# Patient Record
Sex: Female | Born: 1990 | Race: Asian | Hispanic: No | Marital: Married | Smoking: Never smoker
Health system: Southern US, Community
[De-identification: ages and names within clinical notes are randomized; demographics above are authoritative.]

## PROBLEM LIST (undated history)

## (undated) DIAGNOSIS — Z789 Other specified health status: Secondary | ICD-10-CM

## (undated) HISTORY — PX: NO PAST SURGERIES: SHX2092

## (undated) HISTORY — DX: Other specified health status: Z78.9

---

## 2020-01-19 ENCOUNTER — Other Ambulatory Visit: Payer: Self-pay

## 2020-01-19 DIAGNOSIS — Z20822 Contact with and (suspected) exposure to covid-19: Secondary | ICD-10-CM

## 2020-01-20 LAB — SARS-COV-2, NAA 2 DAY TAT

## 2020-01-20 LAB — NOVEL CORONAVIRUS, NAA: SARS-CoV-2, NAA: NOT DETECTED

## 2020-04-01 NOTE — L&D Delivery Note (Signed)
OB/GYN Faculty Practice Delivery Note  Carolyn Perez is a 30 y.o. O1I1030 s/p SVD at [redacted]w[redacted]d. She was admitted for SOL.   ROM: 2h 65m with clear fluid GBS Status: negative  Delivery Date/Time: 03/02/2021 at 1356 Delivery: Called to room and patient was complete and pushing. Head delivered LOA. No nuchal cord present. Shoulder and body delivered in usual fashion. Infant with spontaneous cry, placed on mother's abdomen, dried and stimulated. Cord clamped x 2 after 1-minute delay, and cut by RN. Cord blood drawn. Placenta delivered spontaneously with gentle cord traction. Fundus firm with massage and Pitocin. Labia, perineum, vagina, and cervix were inspected, and patient was found to have a 1st degree perineal laceration that was hemostatic and did not require repair.  Placenta: intact, 3VC - sent to L&D Complications: none Lacerations: none EBL: 50 cc Analgesia: epidural   Infant: viable female  APGARs 9, 9  3255 g  Yves Dill, MD PGY1

## 2020-07-13 ENCOUNTER — Ambulatory Visit (INDEPENDENT_AMBULATORY_CARE_PROVIDER_SITE_OTHER): Payer: Self-pay

## 2020-07-13 ENCOUNTER — Encounter: Payer: Self-pay | Admitting: Obstetrics and Gynecology

## 2020-07-13 ENCOUNTER — Other Ambulatory Visit: Payer: Self-pay

## 2020-07-13 DIAGNOSIS — Z3201 Encounter for pregnancy test, result positive: Secondary | ICD-10-CM

## 2020-07-13 DIAGNOSIS — Z3200 Encounter for pregnancy test, result unknown: Secondary | ICD-10-CM

## 2020-07-13 LAB — POCT URINE PREGNANCY: Preg Test, Ur: POSITIVE — AB

## 2020-07-13 NOTE — Progress Notes (Signed)
..   Carolyn Perez presents today for UPT. She has no unusual complaints. LMP:05-30-20    OBJECTIVE: Appears well, in no apparent distress.  OB History    Gravida  3   Para  2   Term  2   Preterm      AB      Living  2     SAB      IAB      Ectopic      Multiple      Live Births  2          Home UPT Result: Positive In-Office UPT result:Positive I have reviewed the patient's medical, obstetrical, social, and family histories, and medications.   ASSESSMENT: Positive pregnancy test  PLAN Prenatal care to be completed at: [facility]

## 2020-08-02 ENCOUNTER — Other Ambulatory Visit: Payer: Self-pay

## 2020-08-02 ENCOUNTER — Emergency Department (HOSPITAL_COMMUNITY)
Admission: EM | Admit: 2020-08-02 | Discharge: 2020-08-02 | Disposition: A | Discharge status: Home / Self Care | Payer: Medicaid Other | Attending: Emergency Medicine | Admitting: Emergency Medicine

## 2020-08-02 ENCOUNTER — Encounter (HOSPITAL_COMMUNITY): Payer: Self-pay

## 2020-08-02 ENCOUNTER — Emergency Department (HOSPITAL_COMMUNITY): Payer: Medicaid Other

## 2020-08-02 DIAGNOSIS — O209 Hemorrhage in early pregnancy, unspecified: Secondary | ICD-10-CM

## 2020-08-02 DIAGNOSIS — R1030 Lower abdominal pain, unspecified: Secondary | ICD-10-CM | POA: Insufficient documentation

## 2020-08-02 DIAGNOSIS — O26891 Other specified pregnancy related conditions, first trimester: Secondary | ICD-10-CM | POA: Diagnosis not present

## 2020-08-02 DIAGNOSIS — R109 Unspecified abdominal pain: Secondary | ICD-10-CM | POA: Diagnosis not present

## 2020-08-02 DIAGNOSIS — O208 Other hemorrhage in early pregnancy: Secondary | ICD-10-CM | POA: Insufficient documentation

## 2020-08-02 DIAGNOSIS — Z3A09 9 weeks gestation of pregnancy: Secondary | ICD-10-CM | POA: Diagnosis not present

## 2020-08-02 LAB — CBC WITH DIFFERENTIAL/PLATELET
Abs Immature Granulocytes: 0.03 10*3/uL (ref 0.00–0.07)
Basophils Absolute: 0.1 10*3/uL (ref 0.0–0.1)
Basophils Relative: 1 %
Eosinophils Absolute: 0.1 10*3/uL (ref 0.0–0.5)
Eosinophils Relative: 1 %
HCT: 38.3 % (ref 36.0–46.0)
Hemoglobin: 12.2 g/dL (ref 12.0–15.0)
Immature Granulocytes: 0 %
Lymphocytes Relative: 15 %
Lymphs Abs: 1.4 10*3/uL (ref 0.7–4.0)
MCH: 27.2 pg (ref 26.0–34.0)
MCHC: 31.9 g/dL (ref 30.0–36.0)
MCV: 85.5 fL (ref 80.0–100.0)
Monocytes Absolute: 0.4 10*3/uL (ref 0.1–1.0)
Monocytes Relative: 5 %
Neutro Abs: 6.9 10*3/uL (ref 1.7–7.7)
Neutrophils Relative %: 78 %
Platelets: 224 10*3/uL (ref 150–400)
RBC: 4.48 MIL/uL (ref 3.87–5.11)
RDW: 13.1 % (ref 11.5–15.5)
WBC: 8.9 10*3/uL (ref 4.0–10.5)
nRBC: 0 % (ref 0.0–0.2)

## 2020-08-02 LAB — HCG, QUANTITATIVE, PREGNANCY: hCG, Beta Chain, Quant, S: 192212 m[IU]/mL — ABNORMAL HIGH (ref <5)

## 2020-08-02 LAB — URINALYSIS, ROUTINE W REFLEX MICROSCOPIC
Bilirubin Urine: NEGATIVE
Glucose, UA: NEGATIVE mg/dL
Hgb urine dipstick: NEGATIVE
Ketones, ur: 5 mg/dL — AB
Leukocytes,Ua: NEGATIVE
Nitrite: NEGATIVE
Protein, ur: NEGATIVE mg/dL
Specific Gravity, Urine: 1.005 (ref 1.005–1.030)
pH: 8 (ref 5.0–8.0)

## 2020-08-02 LAB — BASIC METABOLIC PANEL
Anion gap: 5 (ref 5–15)
BUN: 7 mg/dL (ref 6–20)
CO2: 24 mmol/L (ref 22–32)
Calcium: 8.7 mg/dL — ABNORMAL LOW (ref 8.9–10.3)
Chloride: 107 mmol/L (ref 98–111)
Creatinine, Ser: 0.47 mg/dL (ref 0.44–1.00)
GFR, Estimated: >60 mL/min (ref >60)
Glucose, Bld: 83 mg/dL (ref 70–99)
Potassium: 4.1 mmol/L (ref 3.5–5.1)
Sodium: 136 mmol/L (ref 135–145)

## 2020-08-02 LAB — WET PREP, GENITAL
Clue Cells Wet Prep HPF POC: NONE SEEN
Sperm: NONE SEEN
Trich, Wet Prep: NONE SEEN
Yeast Wet Prep HPF POC: NONE SEEN

## 2020-08-02 NOTE — ED Provider Notes (Signed)
[facility]-EMERGENCY DEPT Provider Note   CSN: [id number] Arrival date & time: 08/02/20  1129    History Chief Complaint  Patient presents with  . Bleeding in Pregnancy    Carolyn Perez is a 30 y.o. female G3P2 presents for vaginal bleeding in pregnancy. Vaginal bleeding, spotting, dark red this AM. Has not had to wear a pad. Mild lower abd cramping. No vag dc.  No current bleeding.  No current pain.  Denies fever, chills, nausea, vomiting, headache, lightheadedness, dizziness, dysuria, hematuria, focal right lower quadrant left lower quadrant pain.  Pain is not worse with eating or food intake.  She is having bowel movements.  She denies any concerns for any STDs.  Recently followed at [facility] for her prior pregnancies her recently moved to [place] areas.  She plans to follow-up with the health department for her OB/GYN care.  Denies additional aggravating or alleviating factors  History obtained from patient and past medical records.  No interpreter used.   B positive in [facility] History  HPI     History reviewed. No pertinent past medical history.  There are no problems to display for this patient.   History reviewed. No pertinent surgical history.   OB History    Gravida  3   Para  2   Term  2   Preterm  0   AB  0   Living  2     SAB  0   IAB  0   Ectopic  0   Multiple      Live Births  2           History reviewed. No pertinent family history.  Social History   Tobacco Use  . Smoking status: Never Smoker  . Smokeless tobacco: Never Used    Home Medications Prior to Admission medications   Not on File    Allergies    Patient has no known allergies.  Review of Systems   Review of Systems  Constitutional: Negative.   HENT: Negative.   Respiratory: Negative.   Cardiovascular: Negative.   Gastrointestinal: Positive for abdominal pain. Negative for abdominal distention, constipation, diarrhea, nausea and vomiting.   Genitourinary: Positive for vaginal bleeding. Negative for decreased urine volume, difficulty urinating, dysuria, flank pain, frequency, hematuria, menstrual problem, pelvic pain, urgency, vaginal discharge and vaginal pain.  Musculoskeletal: Negative.   Neurological: Negative.   All other systems reviewed and are negative.   Physical Exam Updated Vital Signs BP 106/71 (BP Location: Right Arm)   Pulse 87   Temp 97.6 F (36.4 C) (Oral)   Resp 18   LMP 05/30/2020   SpO2 100%   Physical Exam Vitals and nursing note reviewed. Exam conducted with a chaperone present.  Constitutional:      General: She is not in acute distress.    Appearance: She is well-developed. She is not ill-appearing, toxic-appearing or diaphoretic.  HENT:     Head: Normocephalic and atraumatic.     Nose: Nose normal.     Mouth/Throat:     Mouth: Mucous membranes are moist.  Eyes:     Pupils: Pupils are equal, round, and reactive to light.  Cardiovascular:     Rate and Rhythm: Normal rate.     Pulses: Normal pulses.     Heart sounds: Normal heart sounds.  Pulmonary:     Effort: Pulmonary effort is normal. No respiratory distress.     Breath sounds: Normal breath sounds.  Abdominal:  General: Bowel sounds are normal. There is no distension.     Palpations: Abdomen is soft.     Tenderness: There is abdominal tenderness in the suprapubic area.     Hernia: No hernia is present.     Comments: Minimal lower abd pain. No rebound or guarding.  Genitourinary:    Comments: Normal appearing external female genitalia without rashes or lesions, normal vaginal epithelium. Normal appearing cervix without discharge or petechiae. Cervical os is closed. There is scant bleeding noted at the os. No odor. Bimanual: No CMT, nontender.  No palpable adnexal masses or tenderness. Uterus midline and not fixed. Rectovaginal exam was deferred.  No cystocele or rectocele noted. No pelvic lymphadenopathy noted. Wet prep was  obtained.  Cultures for gonorrhea and chlamydia collected. Exam performed with chaperone in room. Musculoskeletal:        General: Normal range of motion.     Cervical back: Normal range of motion.  Skin:    General: Skin is warm and dry.  Neurological:     Mental Status: She is alert.     ED Results / Procedures / Treatments   Labs (all labs ordered are listed, but only abnormal results are displayed) Labs Reviewed  WET PREP, GENITAL - Abnormal; Notable for the following components:      Result Value   WBC, Wet Prep HPF POC MANY (*)    All other components within normal limits  BASIC METABOLIC PANEL - Abnormal; Notable for the following components:   Calcium 8.7 (*)    All other components within normal limits  HCG, QUANTITATIVE, PREGNANCY - Abnormal; Notable for the following components:   hCG, Beta Chain, Quant, S 419,379 (*)    All other components within normal limits  URINALYSIS, ROUTINE W REFLEX MICROSCOPIC - Abnormal; Notable for the following components:   Color, Urine STRAW (*)    Ketones, ur 5 (*)    All other components within normal limits  CBC WITH DIFFERENTIAL/PLATELET  GC/CHLAMYDIA PROBE AMP ([place]) NOT AT [facility]    EKG None  Radiology US OB Comp < 14 Wks  Result Date: 08/02/2020 CLINICAL DATA:  Vaginal bleeding in 1st trimester pregnancy. Unsure of LMP. EXAM: OBSTETRIC <14 WK ULTRASOUND TECHNIQUE: Transabdominal ultrasound was performed for evaluation of the gestation as well as the maternal uterus and adnexal regions. COMPARISON:  None. FINDINGS: Intrauterine gestational sac: Single Yolk sac:  Visualized. Embryo:  Visualized. Cardiac Activity: Visualized. Heart Rate: 180 bpm CRL:   25 mm   9 w 1 d                  [place] EDC: 03/07/2021 Subchorionic hemorrhage:  None visualized. Maternal uterus/adnexae: Normal appearance of right ovary. Nonvisualization of left ovary, however no mass or abnormal free fluid identified. IMPRESSION: Single living IUP with  estimated gestational age of [redacted] weeks 1 day, and [place] EDC of 03/07/2021. No maternal uterine or adnexal abnormality identified. Electronically Signed   By: Danae Orleans M.D.   On: 08/02/2020 14:54    Procedures Procedures   Medications Ordered in ED Medications - No data to display  ED Course  I have reviewed the triage vital signs and the nursing notes.  Pertinent labs & imaging results that were available during my care of the patient were reviewed by me and considered in my medical decision making (see chart for details).  30 year old presents for evaluation of bleeding in setting of pregnancy.  Has had ultrasound establish IUP.  Began having  some scant blood this morning.  She is back to her pad.  She has no lightness or dizziness.  Does occasionally have some lower abdominal cramping however none currently.  She has no focal pain.  No urinary complaints. 9 weeks by dates.  Plan on labs, imaging and reassess  Labs and imaging personally reviewed and interpreted:  CBC without leukocytosis Metabolic panel with electrolyte, renal normality UA negative for infection HCG 192.212 Wet prep with many WBC Return with 9-week single IUP  GU exam with scant, old blood at os which is closed.  She has no CMT or adnexal tenderness.   Patient reassessed.  Discussed ultrasound and lab findings.  Reevaluation abdomen soft.  She has no focal pain.  I have low suspicion for  On repeat exam patient does not have a surgical abdomin and there are no peritoneal signs.  No indication of appendicitis, bowel obstruction, bowel perforation, cholecystitis, diverticulitis, PID, TOA, torsion or ectopic pregnancy.    Prior chart reviewed from [facility] in prior pregnancies blood type B positive, Does not need rhogam.  The patient has been appropriately medically screened and/or stabilized in the ED. I have low suspicion for any other emergent medical condition which would require further screening, evaluation or  treatment in the ED or require inpatient management.  Patient is hemodynamically stable and in no acute distress.  Patient able to ambulate in department prior to ED.  Evaluation does not show acute pathology that would require ongoing or additional emergent interventions while in the emergency department or further inpatient treatment.  I have discussed the diagnosis with the patient and answered all questions.  Pain is been managed while in the emergency department and patient has no further complaints prior to discharge.  Patient is comfortable with plan discussed in room and is stable for discharge at this time.  I have discussed strict return precautions for returning to the emergency department.  Patient was encouraged to follow-up with PCP/specialist refer to at discharge.     MDM Rules/Calculators/A&P                          Final Clinical Impression(s) / ED Diagnoses Final diagnoses:  Vaginal bleeding in pregnancy, first trimester  [redacted] weeks gestation of pregnancy    Rx / DC Orders ED Discharge Orders    None       Nachman Sundt A, PA-C 08/02/20 1622    Milagros Loll, MD 08/03/20 [phone]

## 2020-08-02 NOTE — ED Triage Notes (Signed)
Pt arrived via POV, c/o vaginal bleeding. Currently pregnant, due date 03/06/21. Also endorsing some minimal abd pain.

## 2020-08-02 NOTE — Discharge Instructions (Addendum)
Your ultrasound showed a single gestation pregnancy in the correct location.  Follow-up with your OB/GYN

## 2020-08-03 LAB — GC/CHLAMYDIA PROBE AMP (~~LOC~~) NOT AT ARMC
Chlamydia: NEGATIVE
Comment: NEGATIVE
Comment: NORMAL
Neisseria Gonorrhea: NEGATIVE

## 2020-08-23 ENCOUNTER — Ambulatory Visit (INDEPENDENT_AMBULATORY_CARE_PROVIDER_SITE_OTHER): Payer: Medicaid Other

## 2020-08-23 ENCOUNTER — Encounter: Payer: Medicaid Other | Admitting: Women's Health

## 2020-08-23 DIAGNOSIS — Z3480 Encounter for supervision of other normal pregnancy, unspecified trimester: Secondary | ICD-10-CM | POA: Insufficient documentation

## 2020-08-23 MED ORDER — BLOOD PRESSURE KIT DEVI: 1.0000 | 0 refills | Status: DC | PRN

## 2020-08-23 MED ORDER — VITAFOL FE+ 90-0.6-0.4-200 MG PO CAPS: 1.0000 | ORAL_CAPSULE | Freq: Every day | ORAL | 12 refills | Status: AC

## 2020-08-23 NOTE — Progress Notes (Signed)
Agree with A & P. 

## 2020-08-23 NOTE — Progress Notes (Signed)
..  Virtual Visit via Telephone Note  I connected with Carolyn Perez on 08/23/20 at  2:00 PM EDT by telephone and verified that I am speaking with the correct person using two identifiers.  Location:[facility] Patient: Carolyn Perez Provider: Nurse   I discussed the limitations, risks, security and privacy concerns of performing an evaluation and management service by telephone and the availability of in person appointments. I also discussed with the patient that there may be a patient responsible charge related to this service. The patient expressed understanding and agreed to proceed.   History of Present Illness: PRENATAL INTAKE SUMMARY  Carolyn Perez presents today New OB Nurse Interview.  OB History    Gravida  3   Para  2   Term  2   Preterm  0   AB  0   Living  2     SAB  0   IAB  0   Ectopic  0   Multiple      Live Births  2          I have reviewed the patient's medical, obstetrical, social, and family histories, medications, and available lab results.  SUBJECTIVE She has no unusual complaints   Observations/Objective: Initial nurse interview for history/labs (New OB)  EDD: 03-06-21 GA: 46w1dGP: G3P1  GENERAL APPEARANCE: Tele-visit, pt sounded alert and oriented  Assessment and Plan: Normal pregnancy Prenatal care: [facility]  Labs to be completed at next visit with provider on 09-01-20. PHQ-9= 2 Blood pressure kit sent to [facility]   Follow Up Instructions:   I discussed the assessment and treatment plan with the patient. The patient was provided an opportunity to ask questions and all were answered. The patient agreed with the plan and demonstrated an understanding of the instructions.   The patient was advised to call back or seek an in-person evaluation if the symptoms worsen or if the condition fails to improve as anticipated.  I provided 15 minutes of non-face-to-face time during this encounter.   BHinton Lovely RN

## 2020-09-01 ENCOUNTER — Other Ambulatory Visit: Payer: Self-pay

## 2020-09-01 ENCOUNTER — Ambulatory Visit (INDEPENDENT_AMBULATORY_CARE_PROVIDER_SITE_OTHER): Payer: Medicaid Other | Admitting: Obstetrics

## 2020-09-01 ENCOUNTER — Encounter: Payer: Self-pay | Admitting: Obstetrics

## 2020-09-01 VITALS — BP 111/77 | HR 102 | Wt 122.0 lb

## 2020-09-01 DIAGNOSIS — Z3A13 13 weeks gestation of pregnancy: Secondary | ICD-10-CM | POA: Diagnosis not present

## 2020-09-01 DIAGNOSIS — Z3480 Encounter for supervision of other normal pregnancy, unspecified trimester: Secondary | ICD-10-CM

## 2020-09-01 DIAGNOSIS — Z3482 Encounter for supervision of other normal pregnancy, second trimester: Secondary | ICD-10-CM | POA: Diagnosis not present

## 2020-09-01 NOTE — Progress Notes (Signed)
Subjective:    Carolyn Perez is being seen today for her first obstetrical visit.  This is a planned pregnancy. She is at [redacted]w[redacted]d gestation. Her obstetrical history is significant for none. Relationship with FOB: spouse, living together. Patient does intend to breast feed. Pregnancy history fully reviewed.  The information documented in the HPI was reviewed and verified.  Menstrual History: OB History    Gravida  3   Para  2   Term  2   Preterm  0   AB  0   Living  2     SAB  0   IAB  0   Ectopic  0   Multiple      Live Births  2            Patient's last menstrual period was 05/30/2020.    History reviewed. No pertinent past medical history.  History reviewed. No pertinent surgical history.  (Not in a hospital admission)  No Known Allergies  Social History   Tobacco Use  . Smoking status: Never Smoker  . Smokeless tobacco: Never Used  Substance Use Topics  . Alcohol use: Never    History reviewed. No pertinent family history.   Review of Systems Constitutional: negative for weight loss Gastrointestinal: negative for vomiting Genitourinary:negative for genital lesions and vaginal discharge and dysuria Musculoskeletal:negative for back pain Behavioral/Psych: negative for abusive relationship, depression, illegal drug usage and tobacco use    Objective:    BP 111/77   Pulse (!) 102   Wt 122 lb (55.3 kg)   LMP 05/30/2020   BMI 21.61 kg/m  General Appearance:    Alert, cooperative, no distress, appears stated age  Head:    Normocephalic, without obvious abnormality, atraumatic  Eyes:    PERRL, conjunctiva/corneas clear, EOM's intact, fundi    benign, both eyes  Ears:    Normal TM's and external ear canals, both ears  Nose:   Nares normal, septum midline, mucosa normal, no drainage    or sinus tenderness  Throat:   Lips, mucosa, and tongue normal; teeth and gums normal  Neck:   Supple, symmetrical, trachea midline, no adenopathy;    thyroid:  no  enlargement/tenderness/nodules; no carotid   bruit or JVD  Back:     Symmetric, no curvature, ROM normal, no CVA tenderness  Lungs:     Clear to auscultation bilaterally, respirations unlabored  Chest Wall:    No tenderness or deformity   Heart:    Regular rate and rhythm, S1 and S2 normal, no murmur, rub   or gallop  Breast Exam:    No tenderness, masses, or nipple abnormality  Abdomen:     Soft, non-tender, bowel sounds active all four quadrants,    no masses, no organomegaly  Genitalia:    Normal female without lesion, discharge or tenderness  Extremities:   Extremities normal, atraumatic, no cyanosis or edema  Pulses:   2+ and symmetric all extremities  Skin:   Skin color, texture, turgor normal, no rashes or lesions  Lymph nodes:   Cervical, supraclavicular, and axillary nodes normal  Neurologic:   CNII-XII intact, normal strength, sensation and reflexes    throughout      Lab Review Urine pregnancy test Labs reviewed yes Radiologic studies reviewed no  Assessment:    Pregnancy at [redacted]w[redacted]d weeks    Plan:      Prenatal vitamins.  Counseling provided regarding continued use of seat belts, cessation of alcohol consumption, smoking or use of illicit  drugs; infection precautions i.e., influenza/TDAP immunizations, toxoplasmosis,CMV, parvovirus, listeria and varicella; workplace safety, exercise during pregnancy; routine dental care, safe medications, sexual activity, hot tubs, saunas, pools, travel, caffeine use, fish and methlymercury, potential toxins, hair treatments, varicose veins Weight gain recommendations per IOM guidelines reviewed: underweight/BMI< 18.5--> gain 28 - 40 lbs; normal weight/BMI 18.5 - 24.9--> gain 25 - 35 lbs; overweight/BMI 25 - 29.9--> gain 15 - 25 lbs; obese/BMI >30->gain  11 - 20 lbs Problem list reviewed and updated. FIRST/CF mutation testing/NIPT/QUAD SCREEN/fragile X/Ashkenazi Jewish population testing/Spinal muscular atrophy discussed: requested. Role  of ultrasound in pregnancy discussed; fetal survey: requested. Amniocentesis discussed: not indicated.   Orders Placed This Encounter  Procedures  . Culture, OB Urine  . Obstetric Panel, Including HIV  . Hepatitis C Antibody  . Genetic Screening    Follow up in 4 weeks.  I have spent a total of 30 minutes of face-to-face time, excluding clinical staff time, reviewing notes and preparing to see patient, ordering tests and/or medications, and counseling the patient.  Brock Bad, MD 09/01/2020 9:09 AM

## 2020-09-01 NOTE — Progress Notes (Signed)
NOB in office, intake completed on 08-23-20 and u/s done on 08-02-20. Pt reports that she is feeling fetal movement. Pt is married, planned pregnancy

## 2020-09-01 NOTE — Addendum Note (Signed)
Addended by: Natale Milch D on: 09/01/2020 11:49 AM   Modules accepted: Orders

## 2020-09-02 LAB — OBSTETRIC PANEL, INCLUDING HIV
Antibody Screen: NEGATIVE
Basophils Absolute: 0 10*3/uL (ref 0.0–0.2)
Basos: 0 %
EOS (ABSOLUTE): 0 10*3/uL (ref 0.0–0.4)
Eos: 1 %
HIV Screen 4th Generation wRfx: NONREACTIVE
Hematocrit: 39 % (ref 34.0–46.6)
Hemoglobin: 12.2 g/dL (ref 11.1–15.9)
Hepatitis B Surface Ag: NEGATIVE
Immature Grans (Abs): 0 10*3/uL (ref 0.0–0.1)
Immature Granulocytes: 0 %
Lymphocytes Absolute: 1.2 10*3/uL (ref 0.7–3.1)
Lymphs: 14 %
MCH: 26.1 pg — ABNORMAL LOW (ref 26.6–33.0)
MCHC: 31.3 g/dL — ABNORMAL LOW (ref 31.5–35.7)
MCV: 83 fL (ref 79–97)
Monocytes Absolute: 0.4 10*3/uL (ref 0.1–0.9)
Monocytes: 4 %
Neutrophils Absolute: 6.9 10*3/uL (ref 1.4–7.0)
Neutrophils: 81 %
Platelets: 218 10*3/uL (ref 150–450)
RBC: 4.68 x10E6/uL (ref 3.77–5.28)
RDW: 13 % (ref 11.7–15.4)
RPR Ser Ql: NONREACTIVE
Rh Factor: POSITIVE
Rubella Antibodies, IGG: 4.16 index (ref >0.99)
WBC: 8.5 10*3/uL (ref 3.4–10.8)

## 2020-09-02 LAB — HEPATITIS C ANTIBODY: Hep C Virus Ab: <0.1 s/co ratio (ref 0.0–0.9)

## 2020-09-06 LAB — URINE CULTURE, OB REFLEX

## 2020-09-06 LAB — CULTURE, OB URINE

## 2020-09-14 ENCOUNTER — Encounter: Payer: Self-pay | Admitting: Obstetrics

## 2020-09-29 ENCOUNTER — Other Ambulatory Visit: Payer: Self-pay

## 2020-09-29 ENCOUNTER — Ambulatory Visit (INDEPENDENT_AMBULATORY_CARE_PROVIDER_SITE_OTHER): Payer: Medicaid Other | Admitting: Nurse Practitioner

## 2020-09-29 VITALS — BP 124/81 | HR 96 | Wt 130.0 lb

## 2020-09-29 DIAGNOSIS — Z3480 Encounter for supervision of other normal pregnancy, unspecified trimester: Secondary | ICD-10-CM

## 2020-09-29 DIAGNOSIS — Z3A17 17 weeks gestation of pregnancy: Secondary | ICD-10-CM

## 2020-09-29 NOTE — Progress Notes (Signed)
    Subjective:  Carolyn Perez is a 30 y.o. G3P2002 at [redacted]w[redacted]d being seen today for ongoing prenatal care.  She is currently monitored for the following issues for this low-risk pregnancy and has Supervision of other normal pregnancy, antepartum on their problem list.  Patient reports  some lower abdominal pain at night when lying on her side .  Contractions: Not present. Vag. Bleeding: None.  Movement: Present. Denies leaking of fluid.   The following portions of the patient's history were reviewed and updated as appropriate: allergies, current medications, past family history, past medical history, past social history, past surgical history and problem list. Problem list updated.  Objective:   Vitals:   09/29/20 1023  BP: 124/81  Pulse: 96  Weight: 130 lb (59 kg)    Fetal Status: Fetal Heart Rate (bpm): 154 Fundal Height: 16 cm Movement: Present     General:  Alert, oriented and cooperative. Patient is in no acute distress.  Skin: Skin is warm and dry. No rash noted.   Cardiovascular: Normal heart rate noted  Respiratory: Normal respiratory effort, no problems with respiration noted  Abdomen: Soft, gravid, appropriate for gestational age. Pain/Pressure: Present     Pelvic:  Cervical exam deferred        Extremities: Normal range of motion.  Edema: None  Mental Status: Normal mood and affect. Normal behavior. Normal judgment and thought content.   Urinalysis:      Assessment and Plan:  Pregnancy: G3P2002 at [redacted]w[redacted]d  1. Supervision of other normal pregnancy, antepartum Reviewed [place] appt Having some round ligament pain at night. Drink at least 8 8-oz glasses of water every day. Discussed more aches and pains in pregnancy as she is older and this is her 3rd pregnancy Unsure about contraception  - AFP, Serum, Open Spina Bifida   Preterm labor symptoms and general obstetric precautions including but not limited to vaginal bleeding, contractions, leaking of fluid and fetal movement were  reviewed in detail with the patient. Please refer to After Visit Summary for other counseling recommendations.  Return in about 4 weeks (around 10/27/2020) for in person ROB.  Nolene Bernheim, RN, MSN, NP-BC Nurse Practitioner, [facility] for [employer], [facility] 09/29/2020 11:01 AM

## 2020-09-29 NOTE — Progress Notes (Signed)
ROB/AFP.  C/o pain when she stands up for long periods to clean her home.

## 2020-10-01 LAB — AFP, SERUM, OPEN SPINA BIFIDA
AFP MoM: 1.02
AFP Value: 46.8 ng/mL
Gest. Age on Collection Date: 17.3 weeks
Maternal Age At EDD: 32.6 yr
OSBR Risk 1 IN: 10000
Test Results:: NEGATIVE
Weight: 130 [lb_av]

## 2020-10-10 ENCOUNTER — Other Ambulatory Visit: Payer: Self-pay

## 2020-10-10 ENCOUNTER — Ambulatory Visit: Payer: Medicaid Other | Attending: Obstetrics

## 2020-10-10 ENCOUNTER — Other Ambulatory Visit: Payer: Self-pay | Admitting: *Deleted

## 2020-10-10 DIAGNOSIS — Z362 Encounter for other antenatal screening follow-up: Secondary | ICD-10-CM

## 2020-10-10 DIAGNOSIS — Z3480 Encounter for supervision of other normal pregnancy, unspecified trimester: Secondary | ICD-10-CM | POA: Insufficient documentation

## 2020-10-27 ENCOUNTER — Ambulatory Visit (INDEPENDENT_AMBULATORY_CARE_PROVIDER_SITE_OTHER): Payer: Medicaid Other | Admitting: Obstetrics & Gynecology

## 2020-10-27 ENCOUNTER — Other Ambulatory Visit: Payer: Self-pay

## 2020-10-27 VITALS — BP 112/70 | HR 93 | Wt 138.0 lb

## 2020-10-27 DIAGNOSIS — Z3480 Encounter for supervision of other normal pregnancy, unspecified trimester: Secondary | ICD-10-CM

## 2020-10-27 NOTE — Progress Notes (Deleted)
   PRENATAL VISIT NOTE  Subjective:  Carolyn Perez is a 30 y.o. G3P2002 at [redacted]w[redacted]d being seen today for ongoing prenatal care.  She is currently monitored for the following issues for this low-risk pregnancy and has Supervision of other normal pregnancy, antepartum on their problem list.  Patient reports  no FM for 3 days .  Contractions: Not present. Vag. Bleeding: None.  Movement: Present. Denies leaking of fluid.   The following portions of the patient's history were reviewed and updated as appropriate: allergies, current medications, past family history, past medical history, past social history, past surgical history and problem list.   Objective:   Vitals:   10/27/20 1011  BP: 112/70  Pulse: 93  Weight: 138 lb (62.6 kg)    Fetal Status: Fetal Heart Rate (bpm): 148   Movement: Present     General:  Alert, oriented and cooperative. Patient is in no acute distress.  Skin: Skin is warm and dry. No rash noted.   Cardiovascular: Normal heart rate noted  Respiratory: Normal respiratory effort, no problems with respiration noted  Abdomen: Soft, gravid, appropriate for gestational age.  Pain/Pressure: Absent     Pelvic: Cervical exam deferred        Extremities: Normal range of motion.  Edema: None  Mental Status: Normal mood and affect. Normal behavior. Normal judgment and thought content.   Assessment and Plan:  Pregnancy: G3P2002 at [redacted]w[redacted]d 1. Supervision of other normal pregnancy, antepartum NST good FM noted and reactive  Preterm labor symptoms and general obstetric precautions including but not limited to vaginal bleeding, contractions, leaking of fluid and fetal movement were reviewed in detail with the patient. Please refer to After Visit Summary for other counseling recommendations.   Return in about 1 week (around 11/03/2020).  Future Appointments  Date Time Provider Department Center  11/14/2020 10:45 AM [facility] NURSE [facility]  11/14/2020 11:00 AM [facility] US1 [facility]     Scheryl Darter, MD

## 2020-10-27 NOTE — Progress Notes (Signed)
   PRENATAL VISIT NOTE  Subjective:  Carolyn Perez is a 30 y.o. G3P2002 at [redacted]w[redacted]d being seen today for ongoing prenatal care.  She is currently monitored for the following issues for this low-risk pregnancy and has Supervision of other normal pregnancy, antepartum on their problem list.  Patient reports no complaints.  Contractions: Not present. Vag. Bleeding: None.  Movement: Present. Denies leaking of fluid.   The following portions of the patient's history were reviewed and updated as appropriate: allergies, current medications, past family history, past medical history, past social history, past surgical history and problem list.   Objective:   Vitals:   10/27/20 1011  BP: 112/70  Pulse: 93  Weight: 138 lb (62.6 kg)    Fetal Status: Fetal Heart Rate (bpm): 148   Movement: Present     General:  Alert, oriented and cooperative. Patient is in no acute distress.  Skin: Skin is warm and dry. No rash noted.   Cardiovascular: Normal heart rate noted  Respiratory: Normal respiratory effort, no problems with respiration noted  Abdomen: Soft, gravid, appropriate for gestational age.  Pain/Pressure: Absent     Pelvic: Cervical exam deferred        Extremities: Normal range of motion.  Edema: None  Mental Status: Normal mood and affect. Normal behavior. Normal judgment and thought content.   Assessment and Plan:  Pregnancy: G3P2002 at [redacted]w[redacted]d 1. Supervision of other normal pregnancy, antepartum 2 hr GTT next  Preterm labor symptoms and general obstetric precautions including but not limited to vaginal bleeding, contractions, leaking of fluid and fetal movement were reviewed in detail with the patient. Please refer to After Visit Summary for other counseling recommendations.   Return in about 5 weeks (around 12/01/2020).  Future Appointments  Date Time Provider Department Center  11/14/2020 10:45 AM [facility] NURSE [facility]  11/14/2020 11:00 AM [facility] US1 [facility]    Scheryl Darter,  MD

## 2020-10-27 NOTE — Progress Notes (Signed)
+   fetal movement. No complaints.  

## 2020-11-11 ENCOUNTER — Ambulatory Visit: Payer: Medicaid Other | Attending: Maternal & Fetal Medicine

## 2020-11-14 ENCOUNTER — Other Ambulatory Visit: Payer: Self-pay | Admitting: *Deleted

## 2020-11-14 ENCOUNTER — Other Ambulatory Visit: Payer: Self-pay

## 2020-11-14 ENCOUNTER — Encounter: Payer: Self-pay | Admitting: *Deleted

## 2020-11-14 ENCOUNTER — Ambulatory Visit: Payer: Medicaid Other | Admitting: *Deleted

## 2020-11-14 ENCOUNTER — Ambulatory Visit: Payer: Medicaid Other | Attending: Maternal & Fetal Medicine

## 2020-11-14 VITALS — BP 109/65 | HR 86

## 2020-11-14 DIAGNOSIS — Z363 Encounter for antenatal screening for malformations: Secondary | ICD-10-CM

## 2020-11-14 DIAGNOSIS — Z3A24 24 weeks gestation of pregnancy: Secondary | ICD-10-CM | POA: Diagnosis not present

## 2020-11-14 DIAGNOSIS — O322XX0 Maternal care for transverse and oblique lie, not applicable or unspecified: Secondary | ICD-10-CM

## 2020-11-14 DIAGNOSIS — Z3689 Encounter for other specified antenatal screening: Secondary | ICD-10-CM

## 2020-11-14 DIAGNOSIS — Z362 Encounter for other antenatal screening follow-up: Secondary | ICD-10-CM | POA: Insufficient documentation

## 2020-11-14 DIAGNOSIS — Z148 Genetic carrier of other disease: Secondary | ICD-10-CM

## 2020-11-30 ENCOUNTER — Ambulatory Visit (INDEPENDENT_AMBULATORY_CARE_PROVIDER_SITE_OTHER): Payer: Medicaid Other | Admitting: Women's Health

## 2020-11-30 ENCOUNTER — Other Ambulatory Visit: Payer: Self-pay

## 2020-11-30 ENCOUNTER — Other Ambulatory Visit: Payer: Medicaid Other

## 2020-11-30 VITALS — BP 109/71 | HR 90 | Wt 145.0 lb

## 2020-11-30 DIAGNOSIS — Z3480 Encounter for supervision of other normal pregnancy, unspecified trimester: Secondary | ICD-10-CM

## 2020-11-30 DIAGNOSIS — Z3A26 26 weeks gestation of pregnancy: Secondary | ICD-10-CM

## 2020-11-30 DIAGNOSIS — N949 Unspecified condition associated with female genital organs and menstrual cycle: Secondary | ICD-10-CM | POA: Insufficient documentation

## 2020-11-30 DIAGNOSIS — D563 Thalassemia minor: Secondary | ICD-10-CM

## 2020-11-30 DIAGNOSIS — O4440 Low lying placenta NOS or without hemorrhage, unspecified trimester: Secondary | ICD-10-CM | POA: Insufficient documentation

## 2020-11-30 NOTE — Patient Instructions (Addendum)
Maternity Assessment Unit (MAU)  The Maternity Assessment Unit (MAU) is located at the [facility]. The address is: [address], [address], [place], [place] [address]. Please see map below for additional directions.    The Maternity Assessment Unit is designed to help you during your pregnancy, and for up to 6 weeks after delivery, with any pregnancy- or postpartum-related emergencies, if you think you are in labor, or if your water has broken. For example, if you experience nausea and vomiting, vaginal bleeding, severe abdominal or pelvic pain, elevated blood pressure or other problems related to your pregnancy or postpartum time, please come to the Maternity Assessment Unit for assistance.       Preterm Labor The normal length of a pregnancy is 39-41 weeks. Preterm labor is when labor starts before 37 completed weeks of pregnancy. Babies who are born prematurely and survive may not be fully developed and may be at an increased risk for long-term problems such as cerebral palsy, developmental delays, and vision and hearing problems. Babies who are born too early may have problems soon after birth. Premature babies may have problems regulating blood sugar, body temperature, heart rate, and breathing rate. These babies often have trouble with feeding. The risk of having problems is highest for babies who are born before 34 weeks of pregnancy. What are the causes? The exact cause of this condition is not known. What increases the risk? You are more likely to have preterm labor if you have certain risk factors that relate to your medical history, problems with present and past pregnancies, and lifestyle factors. Medical history You have abnormalities of the uterus, including a short cervix. You have STIs (sexually transmitted infections) or other infections of the urinary tract and the vagina. You have chronic illnesses, such as blood clotting  problems, diabetes, or high blood pressure. You are overweight or underweight. Present and past pregnancies You have had preterm labor before. You are pregnant with twins or other multiples. You have been diagnosed with a condition in which the placenta covers your cervix (placenta previa). You waited less than 18 months between giving birth and becoming pregnant again. Your unborn baby has some abnormalities. You have vaginal bleeding during pregnancy. You became pregnant through in vitro fertilization (IVF). Lifestyle and environmental factors You use tobacco products or drink alcohol. You use drugs. You have stress and no social support. You experience domestic violence. You are exposed to certain chemicals or environmental pollutants. Other factors You are younger than age 17 or older than age 35. What are the signs or symptoms? Symptoms of this condition include: Cramps similar to those that can happen during a menstrual period. The cramps may happen with diarrhea. Pain in the abdomen or lower back. Regular contractions that may feel like tightening of the abdomen. A feeling of increased pressure in the pelvis. Increased watery or bloody mucus discharge from the vagina. Water breaking (ruptured amniotic sac). How is this diagnosed? This condition is diagnosed based on: Your medical history and a physical exam. A pelvic exam. An ultrasound. Monitoring your uterus for contractions. Other tests, including: A swab of the cervix to check for a chemical called fetal fibronectin. Urine tests. How is this treated? Treatment for this condition depends on the length of your pregnancy, your condition, and the health of your baby. Treatment may include: Taking medicines, such as: Hormone medicines. These may be given early in pregnancy to help support the pregnancy. Medicines to stop   contractions. Medicines to help mature the baby's lungs. These may be prescribed if the risk of  delivery is high. Medicines to help protect your baby from brain and nerve complications such as cerebral palsy. Bed rest. If the labor happens before 34 weeks of pregnancy, you may need to stay in the hospital. Delivery of the baby. Follow these instructions at home:  Do not use any products that contain nicotine or tobacco. These products include cigarettes, chewing tobacco, and vaping devices, such as e-cigarettes. If you need help quitting, ask your health care provider. Do not drink alcohol. Take over-the-counter and prescription medicines only as told by your health care provider. Rest as told by your health care provider. Return to your normal activities as told by your health care provider. Ask your health care provider what activities are safe for you. Keep all follow-up visits. This is important. How is this prevented? To increase your chance of having a full-term pregnancy: Do not use drugs or take medicines that have not been prescribed to you during your pregnancy. Talk with your health care provider before taking any herbal supplements, even if you have been taking them regularly. Make sure you gain a healthy amount of weight during your pregnancy. Watch for infection. If you think that you might have an infection, get it checked right away. Symptoms of infection may include: Fever. Abnormal vaginal discharge or discharge that smells bad. Pain or burning with urination. Needing to urinate urgently. Frequently urinating or passing small amounts of urine frequently. Blood in your urine or urine that smells bad or unusual. Where to find more information U.S. Department of Health and CytogeneticistHuman Services Office on Women's Health: http://hoffman.com/www.womenshealth.gov [employer]: www.acog.org Centers for Disease Control and Prevention, Preterm Birth: FootballExhibition.com.brwww.cdc.gov Contact a health care provider if: You think you are going into preterm labor. You have signs  or symptoms of preterm labor. You have symptoms of infection. Get help right away if: You are having regular, painful contractions every 5 minutes or less. Your water breaks. Summary Preterm labor is labor that starts before you reach 37 weeks of pregnancy. Delivering your baby early increases your baby's risk of developing long-term problems. You are more likely to have preterm labor if you have certain risk factors that relate to your medical history, problems with present and past pregnancies, and lifestyle factors. Keep all follow-up visits. This is important. Contact a health care provider if you have signs or symptoms of preterm labor. This information is not intended to replace advice given to you by your health care provider. Make sure you discuss any questions you have with your health care provider. Document Revised: 03/21/2020 Document Reviewed: 03/21/2020 Elsevier Patient Education  2022 Elsevier Inc.       Childbirth Education Options: [facility] Classes:  Childbirth education classes can help you get ready for a positive parenting experience. You can also meet other expectant parents and get free stuff for your baby. Each class runs for five weeks on the same night and costs $45 for the mother-to-be and her support person. Medicaid covers the cost if you are eligible. Call [phone] to register. [facility] Childbirth Education: Classes can vary in availability and schedule is subject to change. For most up-to-date information please visit [email or web address] to review and register.             AREA PEDIATRIC/FAMILY PRACTICE PHYSICIANS  [facility] [phone]  [place], [place] [address] Phone - [phone]   Fax - [phone]  JACK AMOS [address] [place], [place]  [address] Phone - [phone]   Fax - [phone]  [facility] [address] [place], [place]   [address] Phone - [phone]   Fax - [phone]  [facility] [address] [place], [place]  [address] Phone - [phone]   Fax - [phone]  [facility] [address] [place], [place]  [address] Phone - [phone]   Fax - [phone]  [facility] [address] [place], [place]  [address] Phone - [phone]   Fax - [phone]  [facility] [place] [address] [place], [place]  [address] Phone - [phone]   Fax - [phone]  [facility] AT [facility] [address] [place] [place], [place]  [address] Phone - [phone]   Fax - [phone]  [facility] AT [facility] [address] [place], [place]  [address] Phone - [phone]   Fax - [phone] [facility] AT [place] [address] [place], [place]  [address] Phone - [phone]   Fax - [phone]  [facility] [address] [place], [place]  [address] Phone - [phone]   Fax - [phone]  [facility] [address] [place], [place]  [address] Phone - [phone]   Fax - [phone]  [facility] [place] [address] [place], [place]  [address] Phone - [phone]   Fax - [phone]  [facility] [address], [address] [place], [place]  [address] Phone - [phone]  [facility] [address] [place], [place]  [address] Phone - [phone]   Fax - [phone]  [facility] [address] [place], [place]  [address] Phone - [phone]   Fax - [phone]  [facility] [address] [place], [place]  [address] Phone - [phone]   Fax - [phone]  [facility] [address] [place], [place]  [address] Phone - [phone]   Fax - [phone]   [facility]  PEDIATRICS [address] [place], [place]  [address] Phone - [phone]   Fax - [phone]  [facility] [address] [place], [place]  [address] Phone - [phone]   Fax - [phone]  DAVID RUBIN [address] [place], [place]  [address] Phone - [phone]   Fax - [phone]  [facility] [address] [place], [place]  [address] Phone - [phone]   Fax - [phone]  [place] [facility] Alita Chyle [address] [place], [place]  [address] Phone - [phone]   Fax - [phone] Gerarda Fraction [address] [place], [place]  [address] Phone - [phone]   Fax - [phone]  [facility] [address] [place], [place]  [address] Phone - [phone]   Fax - [phone]  [facility] [place] [address] [place], [place]  [address] Phone - [phone]   Fax - [phone]        Contraception Choices - www.bedsider.org Contraception, also called birth control, refers to methods or devices that prevent pregnancy. Hormonal methods Contraceptive implant A contraceptive implant is a thin, plastic tube that contains a hormone that prevents pregnancy. It is different from an intrauterine  device (IUD). It is inserted into the upper part of the arm by a health care provider. Implants can be effective for up to 3 years. Progestin-only injections Progestin-only injections are injections of progestin, a synthetic form of the hormone progesterone. They are given every 3 months by a health care provider. Birth control pills Birth control pills are pills that contain hormones that prevent pregnancy. They must be taken once a day, preferably at the same time each day. A prescription is needed to use this method of contraception. Birth control patch The birth control patch contains hormones that prevent pregnancy. It is placed  on the skin and must be changed once a week for three weeks and removed on the fourth week. A prescription is needed to use this method of contraception. Vaginal ring A vaginal ring contains hormones that prevent pregnancy. It is placed in the vagina for three weeks and removed on the fourth week. After that, the process is repeated with a new ring. A prescription is needed to use this method of contraception. Emergency contraceptive Emergency contraceptives prevent pregnancy after unprotected sex. They come in pill form and can be taken up to 5 days after sex. They work best the sooner they are taken after having sex. Most emergency contraceptives are available without a prescription. This method should not be used as your only form of birth control. Barrier methods Female condom A female condom is a thin sheath that is worn over the penis during sex. Condoms keep sperm from going inside a woman's body. They can be used with a sperm-killing substance (spermicide) to increase their effectiveness. They should be thrown away after one use. Female condom A female condom is a soft, loose-fitting sheath that is put into the vagina before sex. The condom keeps sperm from going inside a woman's body. They should be thrown away after one use. Diaphragm A diaphragm is a soft, dome-shaped barrier. It is inserted into the vagina before sex, along with a spermicide. The diaphragm blocks sperm from entering the uterus, and the spermicide kills sperm. A diaphragm should be left in the vagina for 6-8 hours after sex and removed within 24 hours. A diaphragm is prescribed and fitted by a health care provider. A diaphragm should be replaced every 1-2 years, after giving birth, after gaining more than 15 lb (6.8 kg), and after pelvic surgery. Cervical cap A cervical cap is a round, soft latex or plastic cup that fits over the cervix. It is inserted into the vagina before sex, along with spermicide. It blocks sperm from  entering the uterus. The cap should be left in place for 6-8 hours after sex and removed within 48 hours. A cervical cap must be prescribed and fitted by a health care provider. It should be replaced every 2 years. Sponge A sponge is a soft, circular piece of polyurethane foam with spermicide in it. The sponge helps block sperm from entering the uterus, and the spermicide kills sperm. To use it, you make it wet and then insert it into the vagina. It should be inserted before sex, left in for at least 6 hours after sex, and removed and thrown away within 30 hours. Spermicides Spermicides are chemicals that kill or block sperm from entering the cervix and uterus. They can come as a cream, jelly, suppository, foam, or tablet. A spermicide should be inserted into the vagina with an applicator at least 10-15 minutes before sex to allow time for it to work. The process must be  repeated every time you have sex. Spermicides do not require a prescription. Intrauterine contraception Intrauterine device (IUD) An IUD is a T-shaped device that is put in a woman's uterus. There are two types: Hormone IUD.This type contains progestin, a synthetic form of the hormone progesterone. This type can stay in place for 3-5 years. Copper IUD.This type is wrapped in copper wire. It can stay in place for 10 years. Permanent methods of contraception Female tubal ligation In this method, a woman's fallopian tubes are sealed, tied, or blocked during surgery to prevent eggs from traveling to the uterus. Hysteroscopic sterilization In this method, a small, flexible insert is placed into each fallopian tube. The inserts cause scar tissue to form in the fallopian tubes and block them, so sperm cannot reach an egg. The procedure takes about 3 months to be effective. Another form of birth control must be used during those 3 months. Female sterilization This is a procedure to tie off the tubes that carry sperm (vasectomy). After the  procedure, the man can still ejaculate fluid (semen). Another form of birth control must be used for 3 months after the procedure. Natural planning methods Natural family planning In this method, a couple does not have sex on days when the woman could become pregnant. Calendar method In this method, the woman keeps track of the length of each menstrual cycle, identifies the days when pregnancy can happen, and does not have sex on those days. Ovulation method In this method, a couple avoids sex during ovulation. Symptothermal method This method involves not having sex during ovulation. The woman typically checks for ovulation by watching changes in her temperature and in the consistency of cervical mucus. Post-ovulation method In this method, a couple waits to have sex until after ovulation. Where to find more information Centers for Disease Control and Prevention: FootballExhibition.com.br Summary Contraception, also called birth control, refers to methods or devices that prevent pregnancy. Hormonal methods of contraception include implants, injections, pills, patches, vaginal rings, and emergency contraceptives. Barrier methods of contraception can include female condoms, female condoms, diaphragms, cervical caps, sponges, and spermicides. There are two types of IUDs (intrauterine devices). An IUD can be put in a woman's uterus to prevent pregnancy for 3-5 years. Permanent sterilization can be done through a procedure for males and females. Natural family planning methods involve nothaving sex on days when the woman could become pregnant. This information is not intended to replace advice given to you by your health care provider. Make sure you discuss any questions you have with your health care provider. Document Revised: 08/23/2019 Document Reviewed: 08/23/2019 Elsevier Patient Education  2022 Elsevier Inc.       Round Ligament Pain The round ligaments are a pair of cord-like tissues that help  support the uterus. They can become a source of pain during pregnancy as the ligaments soften and stretch as the baby grows. The pain usually begins in the second trimester (13-28 weeks) of pregnancy, and should only last for a few seconds when it occurs. However, the pain can come and go until the baby is delivered. The pain does not cause harm to the baby. Round ligament pain is usually a short, sharp, and pinching pain, but it can also be a dull, lingering, and aching pain. The pain is felt in the lower side of the abdomen or in the groin. It usually starts deep in the groin and moves up to the outside of the hip area. The pain may happen when you: Suddenly  change position, such as quickly going from a sitting to standing position. Do physical activity. Cough or sneeze. Follow these instructions at home: Managing pain  When the pain starts, relax. Then, try any of these methods to help with the pain: Sit down. Flex your knees up to your abdomen. Lie on your side with one pillow under your abdomen and another pillow between your legs. Sit in a warm bath for 15-20 minutes or until the pain goes away. General instructions Watch your condition for any changes. Move slowly when you sit down or stand up. Stop or reduce your physical activities if they cause pain. Avoid long walks if they cause pain. Take over-the-counter and prescription medicines only as told by your health care provider. Keep all follow-up visits. This is important. Contact a health care provider if: Your pain does not go away with treatment. You feel pain in your back that you did not have before. Your medicine is not helping. You have a fever or chills. You have nausea or vomiting. You have diarrhea. You have pain when you urinate. Get help right away if: You have pain that is a rhythmic, cramping pain similar to labor pains. Labor pains are usually 2 minutes apart, last for about 1 minute, and involve a bearing down  feeling or pressure in your pelvis. You have vaginal bleeding. These symptoms may represent a serious problem that is an emergency. Do not wait to see if the symptoms will go away. Get medical help right away. Call your local emergency services (911 in the [place] Do not drive yourself to the hospital. Summary Round ligament pain is felt in the lower abdomen or groin. This pain usually begins in the second trimester (13-28 weeks) and should only last for a few seconds when it occurs. You may notice the pain when you suddenly change position, when you cough or sneeze, or during physical activity. Relaxing, flexing your knees to your abdomen, lying on one side, or taking a warm bath may help to get rid of the pain. Contact your health care provider if the pain does not go away. This information is not intended to replace advice given to you by your health care provider. Make sure you discuss any questions you have with your health care provider. Document Revised: 05/31/2020 Document Reviewed: 05/31/2020 Elsevier Patient Education  2022 Elsevier Inc.       Postpartum Tubal Ligation Postpartum tubal ligation (PPTL) is a procedure to close the fallopian tubes. This is done to prevent pregnancy. When the fallopian tubes are closed, the eggs that the ovaries release cannot enter the uterus, and sperm cannot reach the eggs. PPTL is done right after childbirth or 1-2 days after childbirth, before the uterus returns to its normal position. If you have a cesarean section, it can be performed at the same time as the procedure. Having this done after childbirth does not make your stay in the hospital longer. PPTL is sometimes called "getting your tubes tied." You should not have this procedure if you want to get pregnant again or if you are unsure about having more children. Tell a health care provider about: Any allergies you have. All medicines you are taking, including vitamins, herbs, eye drops,  creams, and over-the-counter medicines. Any problems you or family members have had with anesthetic medicines. Any blood disorders you have. Any surgeries you have had. Any medical conditions you have or have had. Any past pregnancies. What are the risks? Generally, this is a safe  procedure. However, problems may occur, including: Infection. Bleeding. Damage to other organs in the abdomen. Side effects from anesthetic medicines. Failure of the procedure. If this happens, you could get pregnant. Ectopic pregnancy. This is a pregnancy in which the egg attaches outside the uterus. What happens before the procedure? Ask your health care provider about: How much pain you can expect to have. What medicines you will be given for pain, especially if you are breastfeeding. What happens during the procedure? If you had a vaginal delivery: An IV will be inserted into one of your veins. You will be given one or more of the following: A medicine to help you relax (sedative). A medicine to numb the area (local anesthetic). A medicine to make you fall asleep (general anesthetic). A medicine that is injected into an area of your body to numb everything below the injection site (regional anesthetic). If you have been given a general anesthetic, a tube will be put down your throat to help you breathe. Your bladder may be emptied with a small tube (catheter). An incision will be made just below your belly button. Your fallopian tubes will be located and brought up through the incision. Your fallopian tubes will be tied off, burned (cauterized), or blocked with a clip, ring, or clamp. A small part in the center of each fallopian tube may be removed. The incision will be closed with stitches (sutures). A bandage (dressing) will be placed over the incision. If you had a cesarean delivery: Tubal ligation will be done through the incision that was used for the cesarean delivery of your baby. The incision  will be closed with sutures. A dressing will be placed over the incision. The procedure may vary among health care providers and hospitals. What happens after the procedure? Your blood pressure, heart rate, breathing rate, and blood oxygen level will be monitored until you leave the hospital. You will be given pain medicine as needed. You will be encouraged to get up early and walk to prevent blood clots. If you were given a sedative during the procedure, it can affect you for several hours. Do not drive or operate machinery until your health care provider says that it is safe. Summary Postpartum tubal ligation is a procedure that closes the fallopian tubes to prevent pregnancy. This procedure is done while you are still in the hospital after childbirth. If you have a cesarean section, it can be performed at the same time. Having this done after childbirth does not make your stay in the hospital longer. Postpartum tubal ligation is considered permanent. You should not have this procedure if you want to get pregnant again or if you are unsure about having more children. Talk to your health care provider to see if this procedure is right for you. This information is not intended to replace advice given to you by your health care provider. Make sure you discuss any questions you have with your health care provider. Document Revised: 12/03/2019 Document Reviewed: 12/03/2019 Elsevier Patient Education  2022 [employer].

## 2020-11-30 NOTE — Progress Notes (Signed)
+   Fetal movement. Pt c/o round ligament pain.

## 2020-11-30 NOTE — Progress Notes (Signed)
Subjective:  Carolyn Perez is a 30 y.o. G3P2002 at [redacted]w[redacted]d being seen today for ongoing prenatal care.  She is currently monitored for the following issues for this low-risk pregnancy and has Supervision of other normal pregnancy, antepartum; Alpha thalassemia silent carrier; Low-lying placenta; and Round ligament pain on their problem list.  Patient reports no complaints.  Contractions: Not present. Vag. Bleeding: None.  Movement: Present. Denies leaking of fluid.   The following portions of the patient's history were reviewed and updated as appropriate: allergies, current medications, past family history, past medical history, past social history, past surgical history and problem list. Problem list updated.  Objective:   Vitals:   11/30/20 0818  BP: 109/71  Pulse: 90  Weight: 145 lb (65.8 kg)    Fetal Status: Fetal Heart Rate (bpm): 159   Movement: Present     General:  Alert, oriented and cooperative. Patient is in no acute distress.  Skin: Skin is warm and dry. No rash noted.   Cardiovascular: Normal heart rate noted  Respiratory: Normal respiratory effort, no problems with respiration noted  Abdomen: Soft, gravid, appropriate for gestational age. Pain/Pressure: Present     Pelvic: Vag. Bleeding: None     Cervical exam deferred        Extremities: Normal range of motion.  Edema: None  Mental Status: Normal mood and affect. Normal behavior. Normal judgment and thought content.   Urinalysis:      Assessment and Plan:  Pregnancy: G3P2002 at [redacted]w[redacted]d  1. Supervision of other normal pregnancy, antepartum - Glucose Tolerance, 2 Hours w/1 Hour - CBC - RPR - HIV Antibody (routine testing w rflx) - Tdap vaccine greater than or equal to 7yo IM - CBE info given - peds list given - discussed contraception, info given  PHQ9 SCORE ONLY 08/23/2020  PHQ-9 Total Score 2   2. [redacted] weeks gestation of pregnancy  Preterm labor symptoms and general obstetric precautions including but not limited to  vaginal bleeding, contractions, leaking of fluid and fetal movement were reviewed in detail with the patient. I discussed the assessment and treatment plan with the patient. The patient was provided an opportunity to ask questions and all were answered. The patient agreed with the plan and demonstrated an understanding of the instructions. The patient was advised to call back or seek an in-person office evaluation/go to MAU at [facility] for any urgent or concerning symptoms. Please refer to After Visit Summary for other counseling recommendations.  Return in about 2 weeks (around 12/14/2020) for in-person LOB/APP OK.   Denijah Karrer, Odie Sera, NP

## 2020-12-01 LAB — CBC
Hematocrit: 35.1 % (ref 34.0–46.6)
Hemoglobin: 11.1 g/dL (ref 11.1–15.9)
MCH: 26.5 pg — ABNORMAL LOW (ref 26.6–33.0)
MCHC: 31.6 g/dL (ref 31.5–35.7)
MCV: 84 fL (ref 79–97)
Platelets: 186 10*3/uL (ref 150–450)
RBC: 4.19 x10E6/uL (ref 3.77–5.28)
RDW: 12.2 % (ref 11.7–15.4)
WBC: 7.7 10*3/uL (ref 3.4–10.8)

## 2020-12-01 LAB — HIV ANTIBODY (ROUTINE TESTING W REFLEX): HIV Screen 4th Generation wRfx: NONREACTIVE

## 2020-12-01 LAB — GLUCOSE TOLERANCE, 2 HOURS W/ 1HR
Glucose, 1 hour: 129 mg/dL (ref 65–179)
Glucose, 2 hour: 103 mg/dL (ref 65–152)
Glucose, Fasting: 86 mg/dL (ref 65–91)

## 2020-12-01 LAB — RPR: RPR Ser Ql: NONREACTIVE

## 2020-12-12 ENCOUNTER — Other Ambulatory Visit: Payer: Self-pay

## 2020-12-12 ENCOUNTER — Ambulatory Visit: Payer: Medicaid Other | Attending: Maternal & Fetal Medicine

## 2020-12-12 ENCOUNTER — Ambulatory Visit: Payer: Medicaid Other | Admitting: *Deleted

## 2020-12-12 ENCOUNTER — Encounter: Payer: Self-pay | Admitting: *Deleted

## 2020-12-12 VITALS — BP 111/70 | HR 99

## 2020-12-12 DIAGNOSIS — Z3A28 28 weeks gestation of pregnancy: Secondary | ICD-10-CM

## 2020-12-12 DIAGNOSIS — Z0372 Encounter for suspected placental problem ruled out: Secondary | ICD-10-CM

## 2020-12-12 DIAGNOSIS — O4440 Low lying placenta NOS or without hemorrhage, unspecified trimester: Secondary | ICD-10-CM | POA: Diagnosis present

## 2020-12-12 DIAGNOSIS — N949 Unspecified condition associated with female genital organs and menstrual cycle: Secondary | ICD-10-CM

## 2020-12-12 DIAGNOSIS — Z362 Encounter for other antenatal screening follow-up: Secondary | ICD-10-CM

## 2020-12-15 ENCOUNTER — Other Ambulatory Visit: Payer: Self-pay

## 2020-12-15 ENCOUNTER — Ambulatory Visit (INDEPENDENT_AMBULATORY_CARE_PROVIDER_SITE_OTHER): Payer: Medicaid Other

## 2020-12-15 VITALS — BP 100/67 | HR 95 | Wt 146.0 lb

## 2020-12-15 DIAGNOSIS — Z23 Encounter for immunization: Secondary | ICD-10-CM

## 2020-12-15 DIAGNOSIS — Z3A28 28 weeks gestation of pregnancy: Secondary | ICD-10-CM

## 2020-12-15 DIAGNOSIS — O4440 Low lying placenta NOS or without hemorrhage, unspecified trimester: Secondary | ICD-10-CM

## 2020-12-15 DIAGNOSIS — Z3483 Encounter for supervision of other normal pregnancy, third trimester: Secondary | ICD-10-CM

## 2020-12-15 DIAGNOSIS — Z3480 Encounter for supervision of other normal pregnancy, unspecified trimester: Secondary | ICD-10-CM | POA: Diagnosis not present

## 2020-12-15 DIAGNOSIS — M545 Low back pain, unspecified: Secondary | ICD-10-CM | POA: Diagnosis not present

## 2020-12-15 NOTE — Progress Notes (Signed)
Pt complain of lower back pain. Pt denies any urinary symptoms.

## 2020-12-15 NOTE — Progress Notes (Signed)
LOW-RISK PREGNANCY OFFICE VISIT  Patient name: Carolyn Perez MRN [id number]  Date of birth: 04-14-1990 Chief Complaint:   Routine Prenatal Visit  Subjective:   Carolyn Perez is a 30 y.o. G55P2002 female at [redacted]w[redacted]d with an Estimated Date of Delivery: 03/06/21 being seen today for ongoing management of a low-risk pregnancy aeb has Supervision of other normal pregnancy, antepartum; Alpha thalassemia silent carrier; Low-lying placenta; and Round ligament pain on their problem list.  Patient presents today with backache.  She states that she is having lower back on the left side that started yesterday night.  She states the pain is "like when you move a lot."  She states she has not tried anything and states that "I just wait and if not better I use the heating pad."  Patient endorses fetal movement. Patient denies abdominal cramping or contractions.  Patient denies vaginal concerns including abnormal discharge, leaking of fluid, and bleeding. She denies issues with urination, constipation, or diarrhea. Contractions: Not present. Vag. Bleeding: None.  Movement: Present.  Reviewed past medical,surgical, social, obstetrical and family history as well as problem list, medications and allergies.  Objective   Vitals:   12/15/20 0935  BP: 100/67  Pulse: 95  Weight: 146 lb (66.2 kg)  Body mass index is 25.86 kg/m.  Total Weight Gain:21 lb (9.526 kg)         Physical Examination:   General appearance: Well appearing, and in no distress  Mental status: Alert, oriented to person, place, and time  Skin: Warm & dry  Cardiovascular: Normal heart rate noted  Respiratory: Normal respiratory effort, no distress  Abdomen: Soft, gravid, nontender, AGA with Fundal Height: 30 cm  Pelvic: Cervical exam deferred           Extremities:    Fetal Status: Fetal Heart Rate (bpm): 160  Movement: Present   No results found for this or any previous visit (from the past 24 hour(s)).  Assessment & Plan:  Low-risk pregnancy  of a 30 y.o., G3P2002 at [redacted]w[redacted]d with an Estimated Date of Delivery: 03/06/21   1. Supervision of other normal pregnancy, antepartum -Anticipatory guidance for upcoming appts. -Patient to schedule next appt in 2 weeks for an in-person or virtual visit. -Discussed using mychart platform for virtual visit in two weeks.  Q/C addressed.  -Instructed to take blood pressure ~ 10 minutes prior to scheduled appt time.  -Patient desires influenza and Tdap vaccine today. Given  2. [redacted] weeks gestation of pregnancy -Doing well overall -Reviewed back pain complaint. -Reviewed labs and US findings -Patient still deciding about BTL  3. Low-lying placenta -[place] completed 9/13 -Resolution noted.  4. Acute left-sided low back pain without sciatica -Discussed complaint. Of note Mild +CVAT/discomfort on left side. -Encouraged to monitor and report to MAU for any worsening of symptoms over the weekend. -Encouraged usage of warm compress and tylenol if needed. -Discussed collection of urine for culture to r/o infection. -UC ordered.     Meds: No orders of the defined types were placed in this encounter.  Labs/procedures today:  Lab Orders         Urine Culture       Reviewed: Preterm labor symptoms and general obstetric precautions including but not limited to vaginal bleeding, contractions, leaking of fluid and fetal movement were reviewed in detail with the patient.  All questions were answered.  Follow-up: Return in about 2 weeks (around 12/29/2020) for Virtual LR-ROB.  Orders Placed This Encounter  Procedures   Urine Culture  Flu Vaccine QUAD 36+ mos IM (Fluarix, Quad PF)   Tdap vaccine greater than or equal to 7yo IM   Cherre Robins MSN, CNM 12/15/2020

## 2020-12-19 LAB — URINE CULTURE: Organism ID, Bacteria: NO GROWTH

## 2020-12-29 ENCOUNTER — Encounter: Payer: Self-pay | Admitting: Nurse Practitioner

## 2020-12-29 ENCOUNTER — Telehealth (INDEPENDENT_AMBULATORY_CARE_PROVIDER_SITE_OTHER): Payer: Medicaid Other | Admitting: Nurse Practitioner

## 2020-12-29 VITALS — BP 125/84 | HR 97

## 2020-12-29 DIAGNOSIS — R102 Pelvic and perineal pain: Secondary | ICD-10-CM

## 2020-12-29 DIAGNOSIS — Z3A30 30 weeks gestation of pregnancy: Secondary | ICD-10-CM

## 2020-12-29 DIAGNOSIS — O26893 Other specified pregnancy related conditions, third trimester: Secondary | ICD-10-CM

## 2020-12-29 DIAGNOSIS — N949 Unspecified condition associated with female genital organs and menstrual cycle: Secondary | ICD-10-CM

## 2020-12-29 DIAGNOSIS — O4443 Low lying placenta NOS or without hemorrhage, third trimester: Secondary | ICD-10-CM

## 2020-12-29 DIAGNOSIS — O4440 Low lying placenta NOS or without hemorrhage, unspecified trimester: Secondary | ICD-10-CM

## 2020-12-29 DIAGNOSIS — Z3480 Encounter for supervision of other normal pregnancy, unspecified trimester: Secondary | ICD-10-CM

## 2020-12-29 NOTE — Progress Notes (Signed)
OBSTETRICS PRENATAL VIRTUAL VISIT ENCOUNTER NOTE  [facility] location: [facility]   Patient location: Home  Interpreter on the line for the entire visit.  I connected with Carolyn Perez on 12/29/20 at  9:35 AM EDT by MyChart Video Encounter and verified that I am speaking with the correct person using two identifiers. I discussed the limitations, risks, security and privacy concerns of performing an evaluation and management service virtually and the availability of in person appointments. I also discussed with the patient that there may be a patient responsible charge related to this service. The patient expressed understanding and agreed to proceed. Subjective:  Carolyn Perez is a 30 y.o. G3P2002 at [redacted]w[redacted]d being seen today for ongoing prenatal care.  She is currently monitored for the following issues for this low-risk pregnancy and has Supervision of other normal pregnancy, antepartum; Alpha thalassemia silent carrier; Low-lying placenta; and Round ligament pain on their problem list.  Patient reports  pelvic pain .  Contractions: Not present. Vag. Bleeding: None.  Movement: Present. Denies any leaking of fluid.   The following portions of the patient's history were reviewed and updated as appropriate: allergies, current medications, past family history, past medical history, past social history, past surgical history and problem list.   Objective:   Vitals:   12/29/20 1001  BP: 125/84  Pulse: 97    Fetal Status:     Movement: Present     General:  Alert, oriented and cooperative. Patient is in no acute distress.  Respiratory: Normal respiratory effort, no problems with respiration noted  Mental Status: Normal mood and affect. Normal behavior. Normal judgment and thought content.  Rest of physical exam deferred due to type of encounter  Imaging: [place] MFM OB FOLLOW UP  Result Date: 12/12/2020 ----------------------------------------------------------------------   OBSTETRICS REPORT                       (Signed Final 12/12/2020 04:58 pm) ---------------------------------------------------------------------- Patient Info  ID #:       [id number]                          D.O.B.:  13-Jun-1990 (30 yrs)  Name:       Carolyn Perez                        Visit Date: 12/12/2020 04:11 pm ---------------------------------------------------------------------- Performed By  Attending:        Ma Rings MD         Ref. Address:     [address]                                                             [address] [id number]  [place]                                                             [id number]  Performed By:     Clayton Lefort RDMS       Secondary Phy.:   [facility] A                                                             HARPER  Referred By:      [facility]             [facility] ---------------------------------------------------------------------- Orders  #  Description                           Code        Ordered By  1  [place] MFM OB FOLLOW UP                   [id number]    Lin Landsman ----------------------------------------------------------------------  #  Order #                     Accession #                Episode #  1  [id number]                   [id number]                 [id number] ---------------------------------------------------------------------- Indications  Low lying placenta, antepartum  O44.40  [redacted] weeks gestation of pregnancy                Z3A.28  Genetic carrier Chiropractor Thal)            Z14.8  Encounter for antenatal screening for           Z36.3  malformations  LR NIPS/ Negative AFP ---------------------------------------------------------------------- Fetal Evaluation  Num Of Fetuses:         1  Fetal Heart Rate(bpm):  153  Cardiac Activity:       Observed  Presentation:           Cephalic  Placenta:               Posterior  P. Cord Insertion:      Previously Visualized  Amniotic Fluid  AFI FV:      Within normal limits  AFI Sum(cm)     %Tile       Largest Pocket(cm)  13.22           38          4.65  RUQ(cm)       RLQ(cm)       LUQ(cm)        LLQ(cm)  1.95          4.28          4.65           2.34 ---------------------------------------------------------------------- Biometry  BPD:      70.4  mm     G. Age:  28w 2d         47  %    CI:         72.3   %    70 - 86                                                          FL/HC:      20.2   %    18.8 - 20.6  HC:      263.4  mm     G. Age:  28w 5d         39  %    HC/AC:      1.13        1.05 - 1.21  AC:      233.8  mm     G. Age:  27w 5d         33  %    FL/BPD:     75.6   %    71 - 87  FL:       53.2  mm     G. Age:  28w 2d         42  %    FL/AC:      22.8   %    20 - 24  LV:        6.2  mm  Est. FW:    1163  gm      2 lb 9 oz     38  % ---------------------------------------------------------------------- OB History  Blood Type:   B+  Gravidity:    3         Term:   2        Prem:   0  SAB:   0  TOP:          0       Ectopic:  0        Living: 2 ---------------------------------------------------------------------- Gestational Age  LMP:           28w 0d        Date:  05/30/20                 EDD:   03/06/21  U/S Today:     28w 2d                                        EDD:   03/04/21  Best:          28w 0d     Det. By:  LMP  (05/30/20)          EDD:   03/06/21 ---------------------------------------------------------------------- Anatomy  Cranium:               Appears normal         Aortic Arch:            Previously seen  Cavum:                 Previously seen        Ductal Arch:             Previously seen  Ventricles:            Appears normal         Diaphragm:              Previously seen  Choroid Plexus:        Previously seen        Stomach:                Appears normal, left                                                                        sided  Cerebellum:            Previously seen        Abdomen:                Previously seen  Posterior Fossa:       Previously seen        Abdominal Wall:         Previously seen  Nuchal Fold:           Not applicable (>20    Cord Vessels:           Previously seen                         wks GA)  Face:                  Orbits and profile     Kidneys:                Appear normal  previously seen  Lips:                  Previously seen        Bladder:                Appears normal  Thoracic:              Previously seen        Spine:                  Previously seen  Heart:                 Appears normal         Upper Extremities:      Previously seen                         (4CH, axis, and                         situs)  RVOT:                  Previously seen        Lower Extremities:      Previously seen  LVOT:                  Previously seen  Other:  Fetus appears to be a female. Heels and Open hands prev visualized.          3VV and 3VTV prev visualized. ---------------------------------------------------------------------- Cervix Uterus Adnexa  Cervix  Length:           3.83  cm.  Normal appearance by transabdominal scan.  Right Ovary  Not visualized.  Left Ovary  Not visualized. ---------------------------------------------------------------------- Comments  This patient was seen for a follow up exam as a possible low-  lying placenta was noted on her last ultrasound exam.  She  denies any problems since her last exam.  She was informed that the fetal growth and amniotic fluid  level appears appropriate for her gestational age.  A normal-appearing posterior placenta was noted today.  There were no signs of a low-lying  placenta.  Follow-up as indicated ----------------------------------------------------------------------                   Ma Rings, MD Electronically Signed Final Report   12/12/2020 04:58 pm ----------------------------------------------------------------------   Assessment and Plan:  Pregnancy: [id number] at [redacted]w[redacted]d 1. Supervision of other normal pregnancy, antepartum Will need to discuss pediatrician at next clinic visit  2. Low-lying placenta Resolved 12-12-20  3. Round ligament pain   4. Pelvic pain affecting pregnancy in third trimester, antepartum Worsening pain Did not have in last pregnancy Symptoms classic for pubic symphysis discomfort - pain when walking and getting into car or tub Is not having contractions  - Ambulatory referral to Physical Therapy  Preterm labor symptoms and general obstetric precautions including but not limited to vaginal bleeding, contractions, leaking of fluid and fetal movement were reviewed in detail with the patient. I discussed the assessment and treatment plan with the patient. The patient was provided an opportunity to ask questions and all were answered. The patient agreed with the plan and demonstrated an understanding of the instructions. The patient was advised to call back or seek an in-person office evaluation/go to MAU at [facility] for any urgent or concerning symptoms. Please refer to After Visit Summary for other counseling recommendations.  I provided 11 minutes of face-to-face time during this encounter.  Return in about 2 weeks (around 01/12/2021) for in person ROB.    Currie Paris, NP [facility] [employer], [facility]

## 2020-12-29 NOTE — Progress Notes (Signed)
Virtual ROB [redacted]w[redacted]d  CC: Lower Back pain.

## 2021-01-12 ENCOUNTER — Ambulatory Visit (INDEPENDENT_AMBULATORY_CARE_PROVIDER_SITE_OTHER): Payer: Medicaid Other | Admitting: Nurse Practitioner

## 2021-01-12 ENCOUNTER — Other Ambulatory Visit: Payer: Self-pay

## 2021-01-12 VITALS — BP 106/69 | HR 101 | Wt 150.0 lb

## 2021-01-12 DIAGNOSIS — Z3A32 32 weeks gestation of pregnancy: Secondary | ICD-10-CM

## 2021-01-12 DIAGNOSIS — Z3480 Encounter for supervision of other normal pregnancy, unspecified trimester: Secondary | ICD-10-CM

## 2021-01-12 NOTE — Progress Notes (Addendum)
    Subjective:  Carolyn Perez is a 30 y.o. G3P2002 at [redacted]w[redacted]d being seen today for ongoing prenatal care.  She is currently monitored for the following issues for this low-risk pregnancy and has Supervision of other normal pregnancy, antepartum; Alpha thalassemia silent carrier; Low-lying placenta; and Round ligament pain on their problem list.  Patient reports  fluid leaking last night - none today .  Contractions: Not present. Vag. Bleeding: None.  Movement: Present. Denies leaking of fluid.   The following portions of the patient's history were reviewed and updated as appropriate: allergies, current medications, past family history, past medical history, past social history, past surgical history and problem list. Problem list updated.  Objective:   Vitals:   01/12/21 0948  BP: 106/69  Pulse: (!) 101  Weight: 150 lb (68 kg)    Fetal Status: Fetal Heart Rate (bpm): 144 Fundal Height: 32 cm Movement: Present     General:  Alert, oriented and cooperative. Patient is in no acute distress.  Skin: Skin is warm and dry. No rash noted.   Cardiovascular: Normal heart rate noted  Respiratory: Normal respiratory effort, no problems with respiration noted  Abdomen: Soft, gravid, appropriate for gestational age. Pain/Pressure: Present     Pelvic:  Cervical exam performed   speculum exam - no pooling, no leaking with valsalva, no evidence of ROM.  Cervical exam not done.  Multiparous cervix appears closed and thick.     Extremities: Normal range of motion.  Edema: None  Mental Status: Normal mood and affect. Normal behavior. Normal judgment and thought content.   Urinalysis:      Assessment and Plan:  Pregnancy: G3P2002 at [redacted]w[redacted]d  1. Supervision of other normal pregnancy, antepartum Had loss of fluid last night - none today.  Speculum exam done and no evidence of ROM.  Of Note - does have external sweating and nitrazine paper used for drops seen in groin - negative nitrazine. Has periodic back pain  pain on the side where the baby is positioned  Had some cramping in her hip while being in position for pelvic exam Advised if leaking occurs again, to be seen in MAU  2. [redacted] weeks gestation of pregnancy   Preterm labor symptoms and general obstetric precautions including but not limited to vaginal bleeding, contractions, leaking of fluid and fetal movement were reviewed in detail with the patient. Please refer to After Visit Summary for other counseling recommendations.  Return in about 2 weeks (around 01/26/2021) for in person ROB.  Nolene Bernheim, RN, MSN, NP-BC Nurse Practitioner, [facility] for [employer], [facility] 01/12/2021 10:22 AM

## 2021-01-12 NOTE — Patient Instructions (Signed)
° ° °BRAINSTORMING ° °Develop a Plan °Goals: °Provide a way to start conversation about your new life with a baby °Assist parents in recognizing and using resources within their reach °Help pave the way before birth for an easier period of transition afterwards. ° °Make a list of the following information to keep in a central location: °Full name of Mom and Partner: _____________________________________________ °Baby's full name and Date of Birth: ___________________________________________ °Home Address: ___________________________________________________________ °________________________________________________________________________ °Home Phone: ____________________________________________________________ °Parents' cell numbers: _____________________________________________________ °________________________________________________________________________ °Name and contact info for OB: ______________________________________________ °Name and contact info for Pediatrician:________________________________________ °Contact info for Lactation Consultants: ________________________________________ ° °REST and SLEEP °*You each need at least 4-5 hours of uninterrupted sleep every day. Write specific names and contact information.* °How are you going to rest in the postpartum period? While partner's home? When partner returns to work? When you both return to work? °Where will your baby sleep? °Who is available to help during the day? Evening? Night? °Who could move in for a period to help support you? °What are some ideas to help you get enough sleep? °__________________________________________________________________________________________________________________________________________________________________________________________________________________________________________ °NUTRITIOUS FOOD AND DRINK °*Plan for meals before your baby is born so you can have healthy food to eat during the immediate postpartum  period.* °Who will look after breakfast? Lunch? Dinner? List names and contact information. Brainstorm quick, healthy ideas for each meal. °What can you do before baby is born to prepare meals for the postpartum period? °How can others help you with meals? °Which grocery stores provide online shopping and delivery? °Which restaurants offer take-out or delivery options? °______________________________________________________________________________________________________________________________________________________________________________________________________________________________________________________________________________________________________________________________________________________________________________________________________ ° °CARE FOR MOM °*It's important that mom is cared for and pampered in the postpartum period. Remember, the most important ways new mothers need care are: sleep, nutrition, gentle exercise, and time off.* °Who can come take care of mom during this period? Make a list of people with their contact information. °List some activities that make you feel cared for, rested, and energized? Who can make sure you have opportunities to do these things? °Does mom have a space of her very own within your home that's just for her? Make a “Mama Cave” where she can be comfortable, rest, and renew herself daily. °______________________________________________________________________________________________________________________________________________________________________________________________________________________________________________________________________________________________________________________________________________________________________________________________________ ° ° ° °CARE FOR AND FEEDING BABY °*Knowledgeable and encouraging people will offer the best support with regard to feeding your baby.* °Educate yourself and choose the best feeding option  for your baby. °Make a list of people who will guide, support, and be a resource for you as your care for and feed your baby. (Friends that have breastfed or are currently breastfeeding, lactation consultants, breastfeeding support groups, etc.) °Consider a postpartum doula. (These websites can give you information: dona.org & padanc.org) °Seek out local breastfeeding resources like the [facility] group at [facility] or [employer]. °______________________________________________________________________________________________________________________________________________________________________________________________________________________________________________________________________________________________________________________________________________________________________________________________________ ° °CHORES AND ERRANDS °Who can help with a thorough cleaning before baby is born? °Make a list of people who will help with housekeeping and chores, like laundry, light cleaning, dishes, bathrooms, etc. °Who can run some errands for you? °What can you do to make sure you are stocked with basic supplies before baby is born? °Who is going to do the shopping? °______________________________________________________________________________________________________________________________________________________________________________________________________________________________________________________________________________________________________________________________________________________________________________________________________ ° ° ° ° °Family Adjustment °*Nurture yourselves…it helps parents be more loving and allows for better bonding with their child.* °What sorts of things do you and partner enjoy doing together? Which activities help you to connect and strengthen your relationship? Make a list of those things. Make a list of people whom you   trust to care for your baby so you  can have some time together as a couple. °What types of things help partner feel connected to Mom? Make a list. °What needs will partner have in order to bond with baby? °Other children? Who will care for them when you go into labor and while you are in the hospital? °Think about what the needs of your older children might be. Who can help you meet those needs? In what ways are you helping them prepare for bringing baby home? List some specific strategies you have for family adjustment. °_______________________________________________________________________________________________________________________________________________________________________________________________________________________________________________________________________________________________________________________________________________ ° °SUPPORT °*Someone who can empathize with experiences normalizes your problems and makes them more bearable.* °Make a list of other friends, neighbors, and/or co-workers you know with infants (and small children, if applicable) with whom you can connect. °Make a list of local or online support groups, mom groups, etc. in which you can be involved. °______________________________________________________________________________________________________________________________________________________________________________________________________________________________________________________________________________________________________________________________________________________________________________________________________ ° °Childcare Plans °Investigate and plan for childcare if mom is returning to work. °Talk about mom's concerns about her transition back to work. °Talk about partner's concerns regarding this transition. ° °Mental Health °*Your mental health is one of the highest priorities for a pregnant or postpartum mom.* °1 in 5 women experience anxiety and/or depression from the time  of conception through the first year after birth. °Postpartum Mood Disorders are the #1 complication of pregnancy and childbirth and the suffering experienced by these mothers is not necessary! These illnesses are temporary and respond well to treatment, which often includes self-care, social support, talk therapy, and medication when needed. °Women experiencing anxiety and depression often say things like: “I'm supposed to be happy…why do I feel so sad?”, “Why can't I snap out of it?”, “I'm having thoughts that scare me.” °There is no need to be embarrassed if you are feeling these symptoms: °Overwhelmed, anxious, angry, sad, guilty, irritable, hopeless, exhausted but can't sleep °You are NOT alone. You are NOT to blame. With help, you WILL be well. °Where can I find help? Medical professionals such as your OB, midwife, gynecologist, family practitioner, primary care provider, pediatrician, or mental health providers; [facility] support groups: Feelings After Birth, Breastfeeding Support Group, Baby and Me Group, and Fit 4 Two exercise classes. °You have permission to ask for help. It will confirm your feelings, validate your experiences, share/learn coping strategies, and gain support and encouragement as you heal. You are important! °BRAINSTORM °Make a list of local resources, including resources for mom and for partner. °Identify support groups. °Identify people to call late at night - include names and contact info. °Talk with partner about perinatal mood and anxiety disorders. °Talk with your OB, midwife, and doula about baby blues and about perinatal mood and anxiety disorders. °Talk with your pediatrician about perinatal mood and anxiety disorders. ° ° °Support & Sanity Savers   °What do you really need? ° °Basics °In preparing for a new baby, many expectant parents spend hours shopping for baby clothes, decorating the nursery, and deciding which car seat to buy. Yet most don't think much about what  the reality of parenting a newborn will be like, and what they need to make it through that. So, here is the advice of experienced parents. We know you'll read this, and think “they're exaggerating, I don't really need that.” Just trust us on these, OK? Plan for all of this, and if it turns out you don't need it, come back and teach us how you did it! ° °Must-Haves (Once baby's survival   needs are met, make sure you attend to your own survival needs!) °Sleep °An average newborn sleeps 16-18 hours per day, over 6-7 sleep periods, rarely more than three hours at a time. It is normal and healthy for a newborn to wake throughout the night... but really hard on parents!! °Naps. Prioritize sleep above any responsibilities like: cleaning house, visiting friends, running errands, etc.  Sleep whenever baby sleeps. If you can't nap, at least have restful times when baby eats. The more rest you get, the more patient you will be, the more emotionally stable, and better at solving problems. ° °Food °You may not have realized it would be difficult to eat when you have a newborn. Yet, when we talk to °countless new parents, they say things like “it may be 2:00 pm when I realize I haven't had breakfast yet.” Or “every time we sit down to dinner, baby needs to eat, and my food gets cold, so I don't bother to eat it.” °Finger food. Before your baby is born, stock up with one months' worth of food that: 1) you can eat with one hand while holding a baby, 2) doesn't need to be prepped, 3) is good hot or cold, 4) doesn't spoil when left out for a few hours, and 5) you like to eat. Think about: nuts, dried fruit, Clif bars, pretzels, jerky, gogurt, baby carrots, apples, bananas, crackers, cheez-n-crackers, string cheese, hot pockets or frozen burritos to microwave, garden burgers and breakfast pastries to put in the toaster, yogurt drinks, etc. °Restaurant Menus. Make lists of your favorite restaurants & menu items. When family/friends  want to help, you can give specific information without much thought. They can either bring you the food or send gift cards for just the right meals. °Freezer Meals.  Take some time to make a few meals to put in the freezer ahead of time.  Easy to freeze meals can be anything such as soup, lasagna, chicken pie, or spaghetti sauce. °Set up a Meal Schedule.  Ask friends and family to sign up to bring you meals during the first few weeks of being home. (It can be passed around at baby showers!) You have no idea how helpful this will be until you are in the throes of parenting.  www.takethemameal.com is a great website to check out. °Emotional Support °Know who to call when you're stressed out. Parenting a newborn is very challenging work. There are times when it totally overwhelms your normal coping abilities. EVERY NEW PARENT NEEDS TO HAVE A PLAN FOR WHO TO CALL WHEN THEY JUST CAN'T COPE ANY MORE. (And it has to be someone other than the baby's other parent!) Before your baby is born, come up with at least one person you can call for support - write their phone number down and post it on the refrigerator. °Anxiety & Sadness. Baby blues are normal after pregnancy; however, there are more severe types of anxiety & sadness which can occur and should not be ignored.  They are always treatable, but you have to take the first step by reaching out for help. [facility] offers a “Mom Talk” group which meets every Tuesday from 10 am - 11 am.  This group is for new moms who need support and connection after their babies are born.  Call [phone].  °Really, Really Helpful (Plan for them! Make sure these happen often!!) °Physical Support with Taking Care of Yourselves °Asking friends and family. Before your baby is born, set up a schedule   of people who can come and visit and help out (or ask a friend to schedule for you). Any time someone says “let me know what I can do to help,” sign them up for a day. When they get  there, their job is not to take care of the baby (that's your job and your joy). Their job is to take care of you!  °Postpartum doulas. If you don't have anyone you can call on for support, look into postpartum doulas:  professionals at helping parents with caring for baby, caring for themselves, getting breastfeeding started, and helping with household tasks. www.padanc.org is a helpful website for learning about doulas in our area. °Peer Support / Parent Groups °Why: One of the greatest ideas for new parents is to be around other new parents. Parent groups give you a chance to share and listen to others who are going through the same season of life, get a sense of what is normal infant development by watching several babies learn and grow, share your stories of triumph and struggles with empathetic ears, and forgive your own mistakes when you realize all parents are learning by trial and error. °Where to find: There are many places you can meet other new parents throughout our community.  [facility] offers the following classes for new moms and their little ones:  Baby and Me (Birth to Crawling) and Breastfeeding Support Group. Go to www.conehealthybaby.com or call [phone] for more information. °Time for your Relationship °It's easy to get so caught up in meeting baby's immediate needs that it's hard to find time to connect with your partner, and meet the needs of your relationship. It's also easy to forget what “quality time with your partner” actually looks like. If you take your baby on a date, you'd be amazed how much of your couple time is spent feeding the baby, diapering the baby, admiring the baby, and talking about the baby. °Dating: Try to take time for just the two of you. Babysitter tip: Sometimes when moms are breastfeeding a newborn, they find it hard to figure out how to schedule outings around baby's unpredictable feeding schedules. Have the babysitter come for a three hour period. When  she comes over, if baby has just eaten, you can leave right away, and come back in two hours. If baby hasn't fed recently, you start the date at home. Once baby gets hungry and gets a good feeding in, you can head out for the rest of your date time. °Date Nights at Home: If you can't get out, at least set aside one evening a week to prioritize your relationship: whenever baby dozes off or doesn't have any immediate needs, spend a little time focusing on each other. °Potential conflicts: The main relationship conflicts that come up for new parents are: issues related to sexuality, financial stresses, a feeling of an unfair division of household tasks, and conflicts in parenting styles. The more you can work on these issues before baby arrives, the better!  °Fun and Frills (Don't forget these… and don't feel guilty for indulging in them!) °Everyone has something in life that is a fun little treat that they do just for themselves. It may be: reading the morning paper, or going for a daily jog, or having coffee with a friend once a week, or going to a movie on Friday nights, or fine chocolates, or bubble baths, or curling up with a good book. °Unless you do fun things for yourself every now and then,   it's hard to have the energy for fun with your baby. Whatever your "special" treats are, make sure you find a way to continue to indulge in them after your baby is born. These special moments can recharge you, and allow you to return to baby with a new joy   PERINATAL MOOD DISORDERS: [place]   Emergency and Crisis Resources:  If you are an imminent risk to self or others, are experiencing intense personal distress, and/or have noticed significant changes in activities of daily living, call:  [facility]: [place]: [place]: [phone] Or visit the following crisis centers: Local  Emergency Departments [facility]: [address] [place]. Hours: 8:30AM-5PM. Insurance Accepted: Medicaid, Medicare, and Uninsured.  [facility]  [address], [place] Mon-Friday 8am-3pm  [phone]                                                                                    Non-Crisis Resources: To identify specific providers that are covered by your insurance, contact your insurance company or local agencies: [place]: [phone] [facility] and [place]: [phone] [place]: [phone] [employer] [facility]- [place] [phone]                                                      Outpatient therapy and medication management providers:  [facility] [phone] Hours: 9AM-5PM  Insurance Accepted: Alben Spittle, Lorella Nimrod, Freddrick March, [place], Medicare  [facility] ([place]) [phone] Hours: 8AM-5PM  nsurance Accepted: All insurances EXCEPT AARP, [place], [place], and [place]: [phone]             Hours: 8AM-8PM Insurance Accepted: Cristal Ford, Freddrick March, [place], Medicare, [phone] [facility]: [phone] Hours: 8:30AM-7PM Insurance Accepted: Cristal Ford, Medicaid, Medicare, Tricare, [employer] Counseling:  [place] Accepted:  Holland Falling, Lorella Nimrod, [facility], [place], [facility] [phone] Hours: 9AM-5:30PM Insurance Accepted: Alben Spittle, Charlotte Crumb, and Medicaid, Medicare, [employer] [facility]:  [phone] Hours: 9am-5pm Insurance Accepted: BCBS; they do not accept Medicaid/Medicare [place]: [phone] Hours: 9am-9pm Insurance Accepted: All major insurance including Medicaid  and Medicare [facility]: [phone] Hours: 9AM-5:30PM Insurance Accepted: All insurances EXCEPT Medicaid and Medicare. [facility]: [place]: [place]:  [phone]  [place] (support for children in the NICU and/or with special needs), [place]: [phone]                                                                                     Online Resources: Postpartum Support International: http://jones-berg.com/  800-944-4PPD 2Moms Supporting Moms:  www.momssupportingmoms.net

## 2021-01-12 NOTE — Progress Notes (Signed)
ROB c/o copious amounts clear discharge last night for 30 minutes, pelvic and back pain 4/10.

## 2021-01-19 ENCOUNTER — Other Ambulatory Visit: Payer: Self-pay

## 2021-01-19 ENCOUNTER — Ambulatory Visit: Payer: Medicaid Other | Attending: Nurse Practitioner | Admitting: Physical Therapy

## 2021-01-19 DIAGNOSIS — M6281 Muscle weakness (generalized): Secondary | ICD-10-CM | POA: Diagnosis not present

## 2021-01-19 DIAGNOSIS — R293 Abnormal posture: Secondary | ICD-10-CM | POA: Diagnosis present

## 2021-01-19 NOTE — Patient Instructions (Signed)
Access Code: [email or web address] URL: [email or web address] Date: 01/19/2021 Prepared by: Dwana Curd  Exercises Sidelying Transversus Abdominis Bracing - 1 x daily - 7 x weekly - 3 sets - 10 reps Quadruped Transversus Abdominis Bracing - 1 x daily - 7 x weekly - 2 sets - 10 reps - 5 sec hold

## 2021-01-21 NOTE — Therapy (Addendum)
[facility] @ [facility] [address] [place], [place], [address] Phone: [phone]   Fax:  [phone]  Physical Therapy Evaluation  Patient Details  Name: Carolyn Perez MRN: [id number] Date of Birth: 09-30-1990 Referring Provider (PT): Currie Paris, NP   Encounter Date: 01/19/2021   PT End of Session - 01/21/21 1554     Visit Number 1    Date for PT Re-Evaluation 04/13/21    Authorization Type medicaid healthy blue    PT Start Time 0850    PT Stop Time 0930    PT Time Calculation (min) 40 min    Activity Tolerance Patient tolerated treatment well    Behavior During Therapy [facility] for tasks assessed/performed             Past Medical History:  Diagnosis Date   Medical history non-contributory     Past Surgical History:  Procedure Laterality Date   NO PAST SURGERIES      There were no vitals filed for this visit.    Subjective Assessment - 01/21/21 1617     Subjective Pt states she has been having pain for a few weeks.  Lifting and walking too much hurts    Limitations Lifting    Patient Stated Goals get rid of pain and be able to lift things    Currently in Pain? Yes    Pain Score 6     Pain Location Back    Pain Orientation Lower    Pain Descriptors / Indicators Aching    Pain Type Acute pain    Pain Radiating Towards around to pubic symphasis    Pain Onset 1 to 4 weeks ago    Pain Frequency Constant    Aggravating Factors  moving after sitting, lifting    Pain Relieving Factors massage low back    Effect of Pain on Daily Activities lifting    Multiple Pain Sites No                [facility] PT Assessment - 01/21/21 0001       Assessment   Medical Diagnosis O26.893,R10.2 (ICD-10-CM) - Pelvic pain affecting pregnancy in third trimester, antepartum    Referring Provider (PT) Marvetta Gibbons, Brand Males, NP    Onset Date/Surgical Date --   3 weeks     Precautions   Precautions None      Balance Screen    Has the patient fallen in the past 6 months No      Home Environment   Living Environment Private residence    Living Arrangements Spouse/significant other;Children      Prior Function   Level of Independence Independent      Cognition   Overall Cognitive Status Within Functional Limits for tasks assessed      Functional Tests   Functional tests Squat      Squat   Comments rotates left and leans on the right with hand under belly for support      Posture/Postural Control   Posture/Postural Control Postural limitations    Posture Comments pregnancy posture      ROM / Strength   AROM / PROM / Strength AROM;PROM;Strength      AROM   Overall AROM Comments lumbar flexion 50%      PROM   Overall PROM Comments hip ER 50%      Strength   Overall Strength Comments core weakness noted with transitions and posture      Flexibility  Soft Tissue Assessment /Muscle Length yes    Hamstrings 80%      Palpation   Palpation comment tight gluteals, lumbar muscles      Ambulation/Gait   Gait Pattern Decreased step length - right;Decreased step length - left                        Objective measurements completed on examination: See above findings.     Pelvic Floor Special Questions - 01/21/21 0001     Are you Pregnant or attempting pregnancy? Yes    Number of Pregnancies 3    Number of Vaginal Deliveries 2    Urinary Leakage No    Falling out feeling (prolapse) No    Exam Type Deferred                            PT Long Term Goals - 01/21/21 1614       PT LONG TERM GOAL #1   Title ind with HEP for appropriate core strengthening for healthy pregnancy    Time 12    Period Weeks    Status New    Target Date 04/13/21      PT LONG TERM GOAL #2   Title Pt will report she is able to lift groceries without pain    Baseline unable to correctly lift and has moderate to severe pain    Time 12    Period Weeks    Status New    Target Date  04/13/21      PT LONG TERM GOAL #3   Title Pt will demonstrate squat and lift technique with correct mechanics    Time 12    Period Weeks    Status New    Target Date 04/13/21      PT LONG TERM GOAL #4   Title Pt will report at least 50% less pain with all activities    Time 12    Period Weeks    Status New    Target Date 04/13/21              PT LONG TERM GOAL #5   Title Pt will demonstrate 50% less pain in standing with normal activities due to improved core strength --  Baseline 6/10 NPRS --  Time 12 --  Period Weeks --  Status New --  Target Date 04/13/2021 --  Additional Long Term Goals   Additional Long Term Goals Yes --  PT LONG TERM GOAL #6   Title Pt will be able to correctly lift 10 lb without increased pain due to improved core and hip strength --  Baseline Incorrect mechanics - leans on right and rotates trunk to the left +increased pain --  Time 12 --  Period Weeks --  Status New --  Target Date 04/13/2021          Plan - 01/21/21 1600     Clinical Impression Statement Pt is 30 y/o female presenting to clinic at 32 weeks prenatal with her 3rd pregnancy.  Pt has been having low back and inguinal pain that she has not experienced until a few weeks ago.  Pt has abdominal weakness and pregnancy posture.  Pt has pain with bed mobility and needs a lot of UE support with transitions.  Pt has tight gluteals, lumbar and hamstring muscles.  Pt had decreased symptoms with pelvic compression belt.  She rotates left and leans on  the Rt LE for support when bending down.  Pt will benefit from skilled PT to address strength and other impairments listed to regain maximum function.    Personal Factors and Comorbidities Comorbidity 1    Comorbidities 2 previous vaginal deliveries    Examination-Activity Limitations Lift;Bend;Sleep    Stability/Clinical Decision Making Stable/Uncomplicated    Clinical Decision Making Low    Rehab Potential Excellent    PT Frequency 1x  / week    PT Duration 12 weeks    PT Treatment/Interventions ADLs/Self Care Home Management;Aquatic Therapy;Biofeedback;Cryotherapy;Electrical Stimulation;Moist Heat;Therapeutic activities;Therapeutic exercise;Manual techniques;Patient/family education;Neuromuscular re-education;Taping;Passive range of motion    PT Next Visit Plan STM to gluteals, lumbar, core strength as tolerated    PT Home Exercise Plan tennis ball massage; Access Code: [email or web address] and baby belly band info    Consulted and Agree with Plan of Care Patient             Patient will benefit from skilled therapeutic intervention in order to improve the following deficits and impairments:  Pain, Postural dysfunction, Impaired flexibility, Increased fascial restricitons, Decreased strength, Increased muscle spasms  Visit Diagnosis: Abnormal posture  Muscle weakness (generalized)     Problem List Patient Active Problem List   Diagnosis Date Noted   Alpha thalassemia silent carrier 11/30/2020   Low-lying placenta 11/30/2020   Round ligament pain 11/30/2020   Supervision of other normal pregnancy, antepartum 08/23/2020    Junious Silk, PT 01/21/2021, 4:28 PM  [facility] @ [facility] [address] [place], [place], [address] Phone: [phone]   Fax:  [phone]  Name: Misha Vanoverbeke MRN: [id number] Date of Birth: Jun 19, 1990

## 2021-01-23 ENCOUNTER — Encounter: Payer: Self-pay | Admitting: Physical Therapy

## 2021-01-23 ENCOUNTER — Ambulatory Visit: Payer: Medicaid Other | Admitting: Physical Therapy

## 2021-01-23 ENCOUNTER — Other Ambulatory Visit: Payer: Self-pay

## 2021-01-23 DIAGNOSIS — R293 Abnormal posture: Secondary | ICD-10-CM

## 2021-01-23 DIAGNOSIS — M6281 Muscle weakness (generalized): Secondary | ICD-10-CM

## 2021-01-23 NOTE — Therapy (Signed)
[facility] @ [facility] [address] [place], [place], [address] Phone: [phone]   Fax:  [phone]  Physical Therapy Treatment  Patient Details  Name: Carolyn Perez MRN: [id number] Date of Birth: 1991/02/27 Referring Provider (PT): Currie Paris, NP   Encounter Date: 01/23/2021   PT End of Session - 01/23/21 0800     Visit Number 2    Date for PT Re-Evaluation 04/13/21    Authorization Type medicaid healthy blue    PT Start Time 0800    PT Stop Time 0843    PT Time Calculation (min) 43 min    Activity Tolerance Patient tolerated treatment well    Behavior During Therapy [facility] for tasks assessed/performed             Past Medical History:  Diagnosis Date   Medical history non-contributory     Past Surgical History:  Procedure Laterality Date   NO PAST SURGERIES      There were no vitals filed for this visit.   Subjective Assessment - 01/23/21 0806     Subjective I did what you recommended and it has helped    Limitations Lifting    Patient Stated Goals get rid of pain and be able to lift things    Currently in Pain? No/denies                               [facility] Adult PT Treatment/Exercise - 01/23/21 0001       Exercises   Exercises Lumbar      Lumbar Exercises: Stretches   Other Lumbar Stretch Exercise bilat hip rotation      Lumbar Exercises: Standing   Other Standing Lumbar Exercises hip abduction and flexion with TrA activation      Lumbar Exercises: Seated   Other Seated Lumbar Exercises thoracic flex and rotation      Lumbar Exercises: Sidelying   Other Sidelying Lumbar Exercises side bend in standing with peanut on table      Lumbar Exercises: Quadruped   Single Arm Raise Right;Left;10 reps    Other Quadruped Lumbar Exercises TrA and arm reach in standing leaning on table - 10x each      Manual Therapy   Manual Therapy Soft tissue mobilization    Manual therapy  comments gluteal and lumbar in side lying                     PT Education - 01/23/21 1028     Education Details updates in [employer]) Educated Patient    Methods Explanation;Demonstration;Tactile cues;Verbal cues;Handout    Comprehension Returned demonstration;Verbalized understanding                 PT Long Term Goals - 01/23/21 1030       PT LONG TERM GOAL #1   Title ind with HEP for appropriate core strengthening for healthy pregnancy    Status On-going      PT LONG TERM GOAL #2   Title Pt will report she is able to lift groceries without pain    Status On-going      PT LONG TERM GOAL #3   Title Pt will demonstrate squat and lift technique with correct mechanics    Status On-going      PT LONG TERM GOAL #4   Title Pt will report at least 50% less pain with  all activities    Status On-going                   Plan - 01/23/21 0844     Clinical Impression Statement Pt was feeling some better today since doing the self massage at home.  Pt did have some tension in rectus abdominus.  Pt demonstrated difficulty rounding her back and was given exercises to round her back.  Pt did well with TrA and felt better when activating the core while lifting her leg.  Pt had no increased pain after today's session.  Pt will benefit from skilled PT to continue to address core strength and spine mobility.    PT Treatment/Interventions ADLs/Self Care Home Management;Aquatic Therapy;Biofeedback;Cryotherapy;Electrical Stimulation;Moist Heat;Therapeutic activities;Therapeutic exercise;Manual techniques;Patient/family education;Neuromuscular re-education;Taping;Passive range of motion    PT Next Visit Plan STM to gluteals, lumbar and thoracic, core strength and squats    PT Home Exercise Plan tennis ball massage; Access Code: [email or web address] and baby belly band info    Consulted and Agree with Plan of Care Patient             Patient will benefit from skilled  therapeutic intervention in order to improve the following deficits and impairments:  Pain, Postural dysfunction, Impaired flexibility, Increased fascial restricitons, Decreased strength, Increased muscle spasms  Visit Diagnosis: Abnormal posture  Muscle weakness (generalized)     Problem List Patient Active Problem List   Diagnosis Date Noted   Alpha thalassemia silent carrier 11/30/2020   Low-lying placenta 11/30/2020   Round ligament pain 11/30/2020   Supervision of other normal pregnancy, antepartum 08/23/2020    Junious Silk, PT 01/23/2021, 10:38 AM  [facility] @ [facility] [address] [place], [place], [address] Phone: [phone]   Fax:  [phone]  Name: Carolyn Perez MRN: [id number] Date of Birth: May 05, 1990

## 2021-01-23 NOTE — Patient Instructions (Signed)
Access Code: [email or web address] URL: [email or web address] Date: 01/23/2021 Prepared by: Dwana Curd  Exercises Sidelying Transversus Abdominis Bracing - 1 x daily - 7 x weekly - 3 sets - 10 reps Quadruped Transversus Abdominis Bracing - 1 x daily - 7 x weekly - 2 sets - 10 reps - 5 sec hold Quadruped Alternating Arm Lift - 1 x daily - 7 x weekly - 2 sets - 10 reps Standing Hip Flexion AROM - 1 x daily - 7 x weekly - 3 sets - 10 reps Seated Thoracic Flexion and Rotation with Arms Crossed - 1 x daily - 7 x weekly - 1 sets - 10 reps - 5 sec hold

## 2021-01-29 ENCOUNTER — Ambulatory Visit (INDEPENDENT_AMBULATORY_CARE_PROVIDER_SITE_OTHER): Payer: Medicaid Other

## 2021-01-29 ENCOUNTER — Other Ambulatory Visit: Payer: Self-pay

## 2021-01-29 VITALS — BP 111/73 | HR 103 | Wt 156.0 lb

## 2021-01-29 DIAGNOSIS — Z3480 Encounter for supervision of other normal pregnancy, unspecified trimester: Secondary | ICD-10-CM

## 2021-01-29 DIAGNOSIS — O26893 Other specified pregnancy related conditions, third trimester: Secondary | ICD-10-CM

## 2021-01-29 DIAGNOSIS — R12 Heartburn: Secondary | ICD-10-CM

## 2021-01-29 DIAGNOSIS — Z3A34 34 weeks gestation of pregnancy: Secondary | ICD-10-CM

## 2021-01-29 MED ORDER — FAMOTIDINE 20 MG PO TABS: 20.0000 mg | ORAL_TABLET | Freq: Two times a day (BID) | ORAL | 1 refills | Status: DC

## 2021-01-29 NOTE — Progress Notes (Signed)
   PRENATAL VISIT NOTE  Subjective:  Carolyn Perez is a 30 y.o. G3P2002 at [redacted]w[redacted]d being seen today for ongoing prenatal care.  She is currently monitored for the following issues for this low-risk pregnancy and has Supervision of other normal pregnancy, antepartum; Alpha thalassemia silent carrier; Low-lying placenta; and Round ligament pain on their problem list.  Patient reports heartburn and pelvic pain.  Contractions: Not present. Vag. Bleeding: None.  Movement: Present. Denies leaking of fluid.   The following portions of the patient's history were reviewed and updated as appropriate: allergies, current medications, past family history, past medical history, past social history, past surgical history and problem list.   Objective:   Vitals:   01/29/21 1025  BP: 111/73  Pulse: (!) 103  Weight: 156 lb (70.8 kg)    Fetal Status: Fetal Heart Rate (bpm): 146 Fundal Height: 34 cm Movement: Present     General:  Alert, oriented and cooperative. Patient is in no acute distress.  Skin: Skin is warm and dry. No rash noted.   Cardiovascular: Normal heart rate noted  Respiratory: Normal respiratory effort, no problems with respiration noted  Abdomen: Soft, gravid, appropriate for gestational age.  Pain/Pressure: Present     Pelvic: Cervical exam deferred        Extremities: Normal range of motion.  Edema: None  Mental Status: Normal mood and affect. Normal behavior. Normal judgment and thought content.   Assessment and Plan:  Pregnancy: G3P2002 at [redacted]w[redacted]d 1. Heartburn during pregnancy in third trimester - Rx for pepcid sent - Reviewed foods that may trigger heartburn in pregnancy  - famotidine (PEPCID) 20 MG tablet; Take 1 tablet (20 mg total) by mouth 2 (two) times daily.  Dispense: 60 tablet; Refill: 1  2. Supervision of other normal pregnancy, antepartum - Routine OB care - Pelvic pressure, relieved with support belt. Reassurance provided. Continue to use support belt daily.  - Endorses  active fetal movement - Anticipatory guidance for upcoming appointment provided - GBS and GC/CT at next visit  3. [redacted] weeks gestation of pregnancy   Preterm labor symptoms and general obstetric precautions including but not limited to vaginal bleeding, contractions, leaking of fluid and fetal movement were reviewed in detail with the patient. Please refer to After Visit Summary for other counseling recommendations.   Return in about 2 weeks (around 02/12/2021).  Future Appointments  Date Time Provider Department Center  01/30/2021  9:30 AM Desenglau, Shireen Quan, PT [facility] None  02/06/2021  8:45 AM Desenglau, Shireen Quan, PT [facility] None  02/12/2021 10:55 AM Leftwich-Kirby, Wilmer Floor, CNM [facility] None  02/13/2021  8:00 AM Desenglau, Shireen Quan, PT [facility] None    Brand Males, CNM 01/29/21 11:59 AM

## 2021-01-30 ENCOUNTER — Ambulatory Visit: Payer: Medicaid Other | Attending: Nurse Practitioner | Admitting: Physical Therapy

## 2021-01-30 ENCOUNTER — Encounter: Payer: Self-pay | Admitting: Physical Therapy

## 2021-01-30 DIAGNOSIS — M6281 Muscle weakness (generalized): Secondary | ICD-10-CM | POA: Insufficient documentation

## 2021-01-30 DIAGNOSIS — R293 Abnormal posture: Secondary | ICD-10-CM | POA: Diagnosis present

## 2021-01-30 NOTE — Therapy (Signed)
[facility] [address] [place], [place], [address] Phone: [phone]   Fax:  [phone]  Physical Therapy Treatment  Patient Details  Name: Carolyn Perez MRN: [id number] Date of Birth: [birth year] Referring Provider (PT): Currie Paris, NP   Encounter Date: 01/30/2021   PT End of Session - 01/30/21 0932     Visit Number 3    Date for PT Re-Evaluation 04/13/21    Authorization Type medicaid healthy blue    PT Start Time 0928    PT Stop Time 1000    PT Time Calculation (min) 32 min             Past Medical History:  Diagnosis Date   Medical history non-contributory     Past Surgical History:  Procedure Laterality Date   NO PAST SURGERIES      There were no vitals filed for this visit.   Subjective Assessment - 01/30/21 0931     Subjective I had a little leg and back pain when standing a long time.  I can stand 1-2 hours.  Overall it is better by 50%.    Patient Stated Goals get rid of pain and be able to lift things    Currently in Pain? No/denies                               [facility] Adult PT Treatment/Exercise - 01/30/21 0001       Lumbar Exercises: Stretches   Other Lumbar Stretch Exercise pelvic shift back in standing - 3x each side      Lumbar Exercises: Quadruped   Other Quadruped Lumbar Exercises row with one leg off table - bent over row - no weight engage core      Manual Therapy   Manual therapy comments gluteal and lumbar, thoracic in side lying; sacral mobs and pelvic compress                          PT Long Term Goals - 01/23/21 1030       PT LONG TERM GOAL #1   Title ind with HEP for appropriate core strengthening for healthy pregnancy    Status On-going      PT LONG TERM GOAL #2   Title Pt will report she is able to lift groceries without pain    Status On-going      PT LONG TERM GOAL #3   Title Pt will demonstrate squat and lift  technique with correct mechanics    Status On-going      PT LONG TERM GOAL #4   Title Pt will report at least 50% less pain with all activities    Status On-going                   Plan - 01/30/21 1009     Clinical Impression Statement Pt feels 50% better.  She is just having pain when doing a lot of house work. Pt was able to demonstrate good squatting techniques. She needs cues for bracing when lifting but is doing much better with that.  Pt has tension around left gluteals and stiffness in left SI joint.  She was given a stretch to mobilize the pelvis in order to maintain mobility created with STM today.    PT Treatment/Interventions ADLs/Self Care Home Management;Aquatic Therapy;Biofeedback;Cryotherapy;Electrical Stimulation;Moist Heat;Therapeutic activities;Therapeutic exercise;Manual techniques;Patient/family education;Neuromuscular re-education;Taping;Passive range  of motion    PT Next Visit Plan continue working on lifting and add kettle bell    PT Home Exercise Plan tennis ball massage; Access Code: [email or web address] and baby belly band info    Consulted and Agree with Plan of Care Patient             Patient will benefit from skilled therapeutic intervention in order to improve the following deficits and impairments:  Pain, Postural dysfunction, Impaired flexibility, Increased fascial restricitons, Decreased strength, Increased muscle spasms  Visit Diagnosis: Abnormal posture  Muscle weakness (generalized)     Problem List Patient Active Problem List   Diagnosis Date Noted   Alpha thalassemia silent carrier 11/30/2020   Low-lying placenta 11/30/2020   Round ligament pain 11/30/2020   Supervision of other normal pregnancy, antepartum 08/23/2020    Junious Silk, PT 01/30/2021, 10:28 AM  [facility] @ [facility] [address] [place], [place], [address] Phone: [phone]   Fax:  [phone]  Name: Bethanie Bloxom MRN:  [id number] Date of Birth: 10/18/1990

## 2021-02-05 NOTE — Addendum Note (Signed)
Addended by: Beatris Si on: 02/05/2021 04:42 PM   Modules accepted: Orders

## 2021-02-06 ENCOUNTER — Encounter: Payer: Medicaid Other | Admitting: Physical Therapy

## 2021-02-12 ENCOUNTER — Other Ambulatory Visit: Payer: Self-pay

## 2021-02-12 ENCOUNTER — Other Ambulatory Visit (HOSPITAL_COMMUNITY)
Admission: RE | Admit: 2021-02-12 | Discharge: 2021-02-12 | Disposition: A | Discharge status: Home / Self Care | Payer: Medicaid Other | Source: Ambulatory Visit | Attending: Advanced Practice Midwife | Admitting: Advanced Practice Midwife

## 2021-02-12 ENCOUNTER — Ambulatory Visit (INDEPENDENT_AMBULATORY_CARE_PROVIDER_SITE_OTHER): Payer: Medicaid Other | Admitting: Advanced Practice Midwife

## 2021-02-12 VITALS — BP 119/76 | HR 86 | Wt 158.4 lb

## 2021-02-12 DIAGNOSIS — R102 Pelvic and perineal pain: Secondary | ICD-10-CM

## 2021-02-12 DIAGNOSIS — Z3480 Encounter for supervision of other normal pregnancy, unspecified trimester: Secondary | ICD-10-CM

## 2021-02-12 DIAGNOSIS — O26853 Spotting complicating pregnancy, third trimester: Secondary | ICD-10-CM

## 2021-02-12 DIAGNOSIS — O26893 Other specified pregnancy related conditions, third trimester: Secondary | ICD-10-CM

## 2021-02-12 DIAGNOSIS — Z3A36 36 weeks gestation of pregnancy: Secondary | ICD-10-CM

## 2021-02-12 DIAGNOSIS — R12 Heartburn: Secondary | ICD-10-CM

## 2021-02-12 NOTE — Progress Notes (Signed)
   PRENATAL VISIT NOTE  Subjective:  Carolyn Perez is a 30 y.o. G3P2002 at [redacted]w[redacted]d being seen today for ongoing prenatal care.  She is currently monitored for the following issues for this low-risk pregnancy and has Supervision of other normal pregnancy, antepartum; Alpha thalassemia silent carrier; Low-lying placenta; and Round ligament pain on their problem list.  Patient reports occasional contractions.  Contractions: Not present. Vag. Bleeding: None.  Movement: Present. Denies leaking of fluid.   The following portions of the patient's history were reviewed and updated as appropriate: allergies, current medications, past family history, past medical history, past social history, past surgical history and problem list.   Objective:   Vitals:   02/12/21 1032  BP: 119/76  Pulse: 86  Weight: 158 lb 6.4 oz (71.8 kg)    Fetal Status: Fetal Heart Rate (bpm): 136 Fundal Height: 35 cm Movement: Present  Presentation: Vertex  General:  Alert, oriented and cooperative. Patient is in no acute distress.  Skin: Skin is warm and dry. No rash noted.   Cardiovascular: Normal heart rate noted  Respiratory: Normal respiratory effort, no problems with respiration noted  Abdomen: Soft, gravid, appropriate for gestational age.  Pain/Pressure: Present     Pelvic: Cervical exam performed in the presence of a chaperone Dilation: 1.5 Effacement (%): 40 Station: -3  Extremities: Normal range of motion.  Edema: None  Mental Status: Normal mood and affect. Normal behavior. Normal judgment and thought content.   Assessment and Plan:  Pregnancy: G3P2002 at [redacted]w[redacted]d 1. Supervision of other normal pregnancy, antepartum --Anticipatory guidance about next visits/weeks of pregnancy given. --Next visit in 1 week  - Culture, beta strep (group b only) - GC/Chlamydia probe amp ([place])not at [facility]  2. [redacted] weeks gestation of pregnancy   3. Heartburn during pregnancy in third trimester --Improved with Pepcid   5.  Pelvic pain affecting pregnancy in third trimester, antepartum --Improved with PT   Term labor symptoms and general obstetric precautions including but not limited to vaginal bleeding, contractions, leaking of fluid and fetal movement were reviewed in detail with the patient. Please refer to After Visit Summary for other counseling recommendations.   Return in about 1 week (around 02/19/2021).  Future Appointments  Date Time Provider Department Center  02/12/2021 10:55 AM Leftwich-Kirby, Wilmer Floor, CNM [facility] None  02/13/2021  8:00 AM Desenglau, Shireen Quan, PT [facility] None  02/20/2021  3:50 PM Leftwich-Kirby, Wilmer Floor, CNM [facility] None    Sharen Counter, CNM

## 2021-02-12 NOTE — Patient Instructions (Signed)
Labor Precautions Reasons to come to [facility] at [facility]:  1.  Contractions are  5 minutes apart or less, each last 1 minute, these have been going on for 1-2 hours, and you cannot walk or talk during them 2.  You have a large gush of fluid, or a trickle of fluid that will not stop and you have to wear a pad 3.  You have bleeding that is bright red, heavier than spotting--like menstrual bleeding (spotting can be normal in early labor or after a check of your cervix) 4.  You do not feel the baby moving like he/she normally does  

## 2021-02-13 ENCOUNTER — Telehealth: Payer: Self-pay | Admitting: Physical Therapy

## 2021-02-13 ENCOUNTER — Ambulatory Visit: Payer: Medicaid Other | Admitting: Physical Therapy

## 2021-02-13 LAB — GC/CHLAMYDIA PROBE AMP (~~LOC~~) NOT AT ARMC
Chlamydia: NEGATIVE
Comment: NEGATIVE
Comment: NORMAL
Neisseria Gonorrhea: NEGATIVE

## 2021-02-13 LAB — OB RESULTS CONSOLE GC/CHLAMYDIA: Gonorrhea: NEGATIVE

## 2021-02-13 NOTE — Telephone Encounter (Signed)
Pt was called with interpreter to remind of missed appointment.  Pt was sick and could not make it and reports she will call back to reschedule  Russella Dar, PT 02/13/21 8:28 AM

## 2021-02-16 LAB — CULTURE, BETA STREP (GROUP B ONLY): Strep Gp B Culture: NEGATIVE

## 2021-02-20 ENCOUNTER — Other Ambulatory Visit: Payer: Self-pay

## 2021-02-20 ENCOUNTER — Ambulatory Visit (INDEPENDENT_AMBULATORY_CARE_PROVIDER_SITE_OTHER): Payer: Medicaid Other | Admitting: Advanced Practice Midwife

## 2021-02-20 VITALS — BP 121/77 | HR 101 | Wt 159.0 lb

## 2021-02-20 DIAGNOSIS — Z3A38 38 weeks gestation of pregnancy: Secondary | ICD-10-CM

## 2021-02-20 DIAGNOSIS — Z3480 Encounter for supervision of other normal pregnancy, unspecified trimester: Secondary | ICD-10-CM

## 2021-02-20 NOTE — Progress Notes (Signed)
   PRENATAL VISIT NOTE  Subjective:  Carolyn Perez is a 30 y.o. G3P2002 at [redacted]w[redacted]d being seen today for ongoing prenatal care.  She is currently monitored for the following issues for this low-risk pregnancy and has Supervision of other normal pregnancy, antepartum; Alpha thalassemia silent carrier; Low-lying placenta; and Round ligament pain on their problem list.  Patient reports no complaints.  Contractions: Not present. Vag. Bleeding: None.  Movement: Present. Denies leaking of fluid.   The following portions of the patient's history were reviewed and updated as appropriate: allergies, current medications, past family history, past medical history, past social history, past surgical history and problem list.   Objective:   Vitals:   02/20/21 1547  BP: 121/77  Pulse: (!) 101  Weight: 159 lb (72.1 kg)    Fetal Status: Fetal Heart Rate (bpm): 154 Fundal Height: 37 cm Movement: Present     General:  Alert, oriented and cooperative. Patient is in no acute distress.  Skin: Skin is warm and dry. No rash noted.   Cardiovascular: Normal heart rate noted  Respiratory: Normal respiratory effort, no problems with respiration noted  Abdomen: Soft, gravid, appropriate for gestational age.  Pain/Pressure: Present     Pelvic: Cervical exam deferred        Extremities: Normal range of motion.  Edema: None  Mental Status: Normal mood and affect. Normal behavior. Normal judgment and thought content.   Assessment and Plan:  Pregnancy: G3P2002 at [redacted]w[redacted]d 1. Supervision of other normal pregnancy, antepartum --Anticipatory guidance about next visits/weeks of pregnancy given. --Labor precautions, where to go, info about [facility] given --Next visit in 1 week  2. [redacted] weeks gestation of pregnancy   Term labor symptoms and general obstetric precautions including but not limited to vaginal bleeding, contractions, leaking of fluid and fetal movement were reviewed in detail with the patient. Please refer to After  Visit Summary for other counseling recommendations.   Return in about 1 week (around 02/27/2021).  No future appointments.   Sharen Counter, CNM

## 2021-02-20 NOTE — Patient Instructions (Signed)
Labor Precautions Reasons to come to MAU at [facility]:  1.  Contractions are  5 minutes apart or less, each last 1 minute, these have been going on for 1-2 hours, and you cannot walk or talk during them 2.  You have a large gush of fluid, or a trickle of fluid that will not stop and you have to wear a pad 3.  You have bleeding that is bright red, heavier than spotting--like menstrual bleeding (spotting can be normal in early labor or after a check of your cervix) 4.  You do not feel the baby moving like he/she normally does  

## 2021-03-01 ENCOUNTER — Other Ambulatory Visit: Payer: Self-pay

## 2021-03-01 ENCOUNTER — Encounter: Payer: Self-pay | Admitting: Obstetrics and Gynecology

## 2021-03-01 ENCOUNTER — Ambulatory Visit (INDEPENDENT_AMBULATORY_CARE_PROVIDER_SITE_OTHER): Payer: Medicaid Other | Admitting: Obstetrics and Gynecology

## 2021-03-01 VITALS — BP 125/84 | HR 99 | Wt 163.1 lb

## 2021-03-01 DIAGNOSIS — Z3480 Encounter for supervision of other normal pregnancy, unspecified trimester: Secondary | ICD-10-CM

## 2021-03-01 NOTE — Addendum Note (Signed)
Addended by: Marya Landry D on: 03/01/2021 03:55 PM   Modules accepted: Orders

## 2021-03-01 NOTE — Progress Notes (Signed)
ROB 39.2 wks Reports increase in discharge for 2 days, white, no vaginal itching or irritation.

## 2021-03-01 NOTE — Progress Notes (Signed)
   PRENATAL VISIT NOTE  Subjective:  Carolyn Perez is a 30 y.o. G3P2002 at [redacted]w[redacted]d being seen today for ongoing prenatal care.  She is currently monitored for the following issues for this low-risk pregnancy and has Supervision of other normal pregnancy, antepartum; Alpha thalassemia silent carrier; Low-lying placenta; and Round ligament pain on their problem list.  Patient reports no complaints.  Contractions: Irritability. Vag. Bleeding: None.  Movement: Present. Denies leaking of fluid.   The following portions of the patient's history were reviewed and updated as appropriate: allergies, current medications, past family history, past medical history, past social history, past surgical history and problem list.   Objective:   Vitals:   03/01/21 1345  BP: 125/84  Pulse: 99  Weight: 163 lb 1.6 oz (74 kg)    Fetal Status: Fetal Heart Rate (bpm): 158 Fundal Height: 39 cm Movement: Present  Presentation: Vertex  General:  Alert, oriented and cooperative. Patient is in no acute distress.  Skin: Skin is warm and dry. No rash noted.   Cardiovascular: Normal heart rate noted  Respiratory: Normal respiratory effort, no problems with respiration noted  Abdomen: Soft, gravid, appropriate for gestational age.  Pain/Pressure: Present     Pelvic: Cervical exam performed in the presence of a chaperone Dilation: 2.5 Effacement (%): 50 Station: -3  Extremities: Normal range of motion.  Edema: None  Mental Status: Normal mood and affect. Normal behavior. Normal judgment and thought content.   Assessment and Plan:  Pregnancy: G3P2002 at [redacted]w[redacted]d 1. Supervision of other normal pregnancy, antepartum Patient is doing well without complaints IOL planned at 41 weeks if no labor  Term labor symptoms and general obstetric precautions including but not limited to vaginal bleeding, contractions, leaking of fluid and fetal movement were reviewed in detail with the patient. Please refer to After Visit Summary for  other counseling recommendations.   Return in about 1 week (around 03/08/2021) for in person, ROB, Low risk.  No future appointments.  Catalina Antigua, MD

## 2021-03-02 ENCOUNTER — Inpatient Hospital Stay (HOSPITAL_COMMUNITY)
Admission: AD | Admit: 2021-03-02 | Discharge: 2021-03-03 | DRG: 807 | Disposition: A | Discharge status: Home / Self Care | Payer: Medicaid Other | Attending: Family Medicine | Admitting: Family Medicine

## 2021-03-02 ENCOUNTER — Inpatient Hospital Stay (HOSPITAL_COMMUNITY): Payer: Medicaid Other | Admitting: Anesthesiology

## 2021-03-02 ENCOUNTER — Encounter (HOSPITAL_COMMUNITY): Payer: Self-pay | Admitting: Obstetrics and Gynecology

## 2021-03-02 DIAGNOSIS — O26893 Other specified pregnancy related conditions, third trimester: Secondary | ICD-10-CM | POA: Diagnosis not present

## 2021-03-02 DIAGNOSIS — D563 Thalassemia minor: Secondary | ICD-10-CM | POA: Diagnosis present

## 2021-03-02 DIAGNOSIS — Z20822 Contact with and (suspected) exposure to covid-19: Secondary | ICD-10-CM | POA: Diagnosis present

## 2021-03-02 DIAGNOSIS — Z3A39 39 weeks gestation of pregnancy: Secondary | ICD-10-CM | POA: Diagnosis not present

## 2021-03-02 DIAGNOSIS — N949 Unspecified condition associated with female genital organs and menstrual cycle: Secondary | ICD-10-CM

## 2021-03-02 DIAGNOSIS — O9902 Anemia complicating childbirth: Secondary | ICD-10-CM | POA: Diagnosis not present

## 2021-03-02 DIAGNOSIS — O4440 Low lying placenta NOS or without hemorrhage, unspecified trimester: Secondary | ICD-10-CM

## 2021-03-02 DIAGNOSIS — D649 Anemia, unspecified: Secondary | ICD-10-CM | POA: Diagnosis not present

## 2021-03-02 LAB — CBC
HCT: 35.1 % — ABNORMAL LOW (ref 36.0–46.0)
Hemoglobin: 10.8 g/dL — ABNORMAL LOW (ref 12.0–15.0)
MCH: 24.5 pg — ABNORMAL LOW (ref 26.0–34.0)
MCHC: 30.8 g/dL (ref 30.0–36.0)
MCV: 79.6 fL — ABNORMAL LOW (ref 80.0–100.0)
Platelets: 230 10*3/uL (ref 150–400)
RBC: 4.41 MIL/uL (ref 3.87–5.11)
RDW: 13.8 % (ref 11.5–15.5)
WBC: 10 10*3/uL (ref 4.0–10.5)
nRBC: 0 % (ref 0.0–0.2)

## 2021-03-02 LAB — TYPE AND SCREEN
ABO/RH(D): B POS
Antibody Screen: NEGATIVE

## 2021-03-02 LAB — RESP PANEL BY RT-PCR (FLU A&B, COVID) ARPGX2
Influenza A by PCR: NEGATIVE
Influenza B by PCR: NEGATIVE
SARS Coronavirus 2 by RT PCR: NEGATIVE

## 2021-03-02 LAB — RPR: RPR Ser Ql: NONREACTIVE

## 2021-03-02 MED ORDER — LIDOCAINE HCL (PF) 1 % IJ SOLN: INTRAMUSCULAR | Status: DC | PRN

## 2021-03-02 MED ORDER — SENNOSIDES-DOCUSATE SODIUM 8.6-50 MG PO TABS
2.0000 | ORAL_TABLET | Freq: Every day | ORAL | Status: DC
  Administered 2021-03-03: 2 via ORAL
  Filled 2021-03-02: qty 2

## 2021-03-02 MED ORDER — OXYCODONE HCL 5 MG PO TABS: 5.0000 mg | ORAL_TABLET | ORAL | Status: DC | PRN

## 2021-03-02 MED ORDER — OXYCODONE-ACETAMINOPHEN 5-325 MG PO TABS: 2.0000 | ORAL_TABLET | ORAL | Status: DC | PRN

## 2021-03-02 MED ORDER — DIBUCAINE (PERIANAL) 1 % EX OINT: 1.0000 "application " | TOPICAL_OINTMENT | CUTANEOUS | Status: DC | PRN

## 2021-03-02 MED ORDER — SOD CITRATE-CITRIC ACID 500-334 MG/5ML PO SOLN: 30.0000 mL | ORAL | Status: DC | PRN

## 2021-03-02 MED ORDER — DIPHENHYDRAMINE HCL 50 MG/ML IJ SOLN: 12.5000 mg | INTRAMUSCULAR | Status: DC | PRN

## 2021-03-02 MED ORDER — SIMETHICONE 80 MG PO CHEW: 80.0000 mg | CHEWABLE_TABLET | ORAL | Status: DC | PRN

## 2021-03-02 MED ORDER — LIDOCAINE HCL (PF) 1 % IJ SOLN: 30.0000 mL | INTRAMUSCULAR | Status: DC | PRN

## 2021-03-02 MED ORDER — IBUPROFEN 600 MG PO TABS
600.0000 mg | ORAL_TABLET | Freq: Four times a day (QID) | ORAL | Status: DC
  Administered 2021-03-03 (×3): 600 mg via ORAL
  Filled 2021-03-02 (×3): qty 1

## 2021-03-02 MED ORDER — FENTANYL-BUPIVACAINE-NACL 0.5-0.125-0.9 MG/250ML-% EP SOLN
EPIDURAL | Status: AC
  Filled 2021-03-02: qty 250

## 2021-03-02 MED ORDER — EPHEDRINE 5 MG/ML INJ: 10.0000 mg | INTRAVENOUS | Status: DC | PRN

## 2021-03-02 MED ORDER — LACTATED RINGERS IV SOLN: 500.0000 mL | Freq: Once | INTRAVENOUS | Status: DC

## 2021-03-02 MED ORDER — COCONUT OIL OIL: 1.0000 "application " | TOPICAL_OIL | Status: DC | PRN

## 2021-03-02 MED ORDER — ONDANSETRON HCL 4 MG/2ML IJ SOLN: 4.0000 mg | INTRAMUSCULAR | Status: DC | PRN

## 2021-03-02 MED ORDER — BENZOCAINE-MENTHOL 20-0.5 % EX AERO: 1.0000 "application " | INHALATION_SPRAY | CUTANEOUS | Status: DC | PRN

## 2021-03-02 MED ORDER — PHENYLEPHRINE 40 MCG/ML (10ML) SYRINGE FOR IV PUSH (FOR BLOOD PRESSURE SUPPORT): 80.0000 ug | PREFILLED_SYRINGE | INTRAVENOUS | Status: DC | PRN

## 2021-03-02 MED ORDER — WITCH HAZEL-GLYCERIN EX PADS: 1.0000 "application " | MEDICATED_PAD | CUTANEOUS | Status: DC | PRN

## 2021-03-02 MED ORDER — PRENATAL MULTIVITAMIN CH
1.0000 | ORAL_TABLET | Freq: Every day | ORAL | Status: DC
  Administered 2021-03-03: 1 via ORAL
  Filled 2021-03-02: qty 1

## 2021-03-02 MED ORDER — ACETAMINOPHEN 325 MG PO TABS
650.0000 mg | ORAL_TABLET | ORAL | Status: DC | PRN
  Administered 2021-03-03: 650 mg via ORAL
  Filled 2021-03-02: qty 2

## 2021-03-02 MED ORDER — LIDOCAINE HCL (PF) 1 % IJ SOLN
INTRAMUSCULAR | Status: DC | PRN
  Administered 2021-03-02: 8 mL via EPIDURAL

## 2021-03-02 MED ORDER — TETANUS-DIPHTH-ACELL PERTUSSIS 5-2.5-18.5 LF-MCG/0.5 IM SUSY: 0.5000 mL | PREFILLED_SYRINGE | Freq: Once | INTRAMUSCULAR | Status: DC

## 2021-03-02 MED ORDER — ONDANSETRON HCL 4 MG PO TABS: 4.0000 mg | ORAL_TABLET | ORAL | Status: DC | PRN

## 2021-03-02 MED ORDER — ONDANSETRON HCL 4 MG/2ML IJ SOLN: 4.0000 mg | Freq: Four times a day (QID) | INTRAMUSCULAR | Status: DC | PRN

## 2021-03-02 MED ORDER — DIPHENHYDRAMINE HCL 25 MG PO CAPS: 25.0000 mg | ORAL_CAPSULE | Freq: Four times a day (QID) | ORAL | Status: DC | PRN

## 2021-03-02 MED ORDER — OXYCODONE-ACETAMINOPHEN 5-325 MG PO TABS: 1.0000 | ORAL_TABLET | ORAL | Status: DC | PRN

## 2021-03-02 MED ORDER — FENTANYL-BUPIVACAINE-NACL 0.5-0.125-0.9 MG/250ML-% EP SOLN
12.0000 mL/h | EPIDURAL | Status: DC | PRN
  Administered 2021-03-02: 12 mL/h via EPIDURAL

## 2021-03-02 MED ORDER — LACTATED RINGERS IV SOLN: 500.0000 mL | INTRAVENOUS | Status: DC | PRN

## 2021-03-02 MED ORDER — ACETAMINOPHEN 325 MG PO TABS: 650.0000 mg | ORAL_TABLET | ORAL | Status: DC | PRN

## 2021-03-02 MED ORDER — LACTATED RINGERS IV SOLN: INTRAVENOUS | Status: DC

## 2021-03-02 MED ORDER — OXYTOCIN BOLUS FROM INFUSION
333.0000 mL | Freq: Once | INTRAVENOUS | Status: AC
  Administered 2021-03-02: 333 mL via INTRAVENOUS

## 2021-03-02 MED ORDER — OXYTOCIN-SODIUM CHLORIDE 30-0.9 UT/500ML-% IV SOLN
2.5000 [IU]/h | INTRAVENOUS | Status: DC
  Filled 2021-03-02: qty 500

## 2021-03-02 NOTE — H&P (Signed)
Akaiya Touchette is a 30 y.o. female G2R4270 with IUP at 97w3dby LMP/US presenting for labor.  Ctx became strong around 0300. She reports positive fetal movement. She denies leakage of fluid or vaginal bleeding.  Prenatal History/Complications: PNC at [facility]: Term SVD X 2 w/o problems    - Past Medical History: Past Medical History:  Diagnosis Date   Medical history non-contributory     Past Surgical History: Past Surgical History:  Procedure Laterality Date   NO PAST SURGERIES      Obstetrical History: OB History     Gravida  3   Para  2   Term  2   Preterm  0   AB  0   Living  2      SAB  0   IAB  0   Ectopic  0   Multiple      Live Births  2            Social History: Social History   Socioeconomic History   Marital status: Married    Spouse name: Not on file   Number of children: Not on file   Years of education: Not on file   Highest education level: Not on file  Occupational History   Not on file  Tobacco Use   Smoking status: Never   Smokeless tobacco: Never  Vaping Use   Vaping Use: Never used  Substance and Sexual Activity   Alcohol use: Never   Drug use: Never   Sexual activity: Yes  Other Topics Concern   Not on file  Social History Narrative   ** Merged History Encounter **       Social Determinants of Health   Financial Resource Strain: Not on file  Food Insecurity: Not on file  Transportation Needs: Not on file  Physical Activity: Not on file  Stress: Not on file  Social Connections: Not on file    Family History: Family History  Problem Relation Age of Onset   Diabetes Neg Hx    Hypertension Neg Hx     Allergies: No Known Allergies  Medications Prior to Admission  Medication Sig Dispense Refill Last Dose   Prenat-Fe Poly-Methfol-FA-DHA (VITAFOL FE+) 90-0.6-0.4-200 MG CAPS Take 1 tablet by mouth daily. 30 capsule 12 Past Week   Blood Pressure Monitoring (BLOOD PRESSURE KIT) DEVI 1 kit by Does not apply route  as needed. Large cuff 1 each 0    famotidine (PEPCID) 20 MG tablet Take 1 tablet (20 mg total) by mouth 2 (two) times daily. (Patient not taking: Reported on 03/01/2021) 60 tablet 1     Review of Systems   Constitutional: Negative for fever and chills Eyes: Negative for visual disturbances Respiratory: Negative for shortness of breath, dyspnea Cardiovascular: Negative for chest pain or palpitations  Gastrointestinal: Negative for vomiting, diarrhea and constipation.  POSITIVE for abdominal pain (contractions) Genitourinary: Negative for dysuria and urgency Musculoskeletal: Negative for back pain, joint pain, myalgias  Neurological: Negative for dizziness and headaches  Blood pressure 105/74, pulse (!) 105, temperature 98.5 F (36.9 C), temperature source Oral, resp. rate 20, height _0  (1.6 m), weight 72.6 kg, last menstrual period 05/30/2020. General appearance: alert, cooperative, no distress, and extremely calm Lungs: normal respiratory effort Heart: regular rate and rhythm Abdomen: soft, non-tender; bowel sounds normal Extremities: Homans sign is negative, no sign of DVT DTR's 2+ Presentation: cephalic Fetal monitoring  Baseline: 145 bpm, Variability: Good {> 6 bpm), Accelerations: Reactive, and Decelerations: Absent Uterine activity  mild and irregular, one thus far whilst in L&D   Cx 8cms per RN in MAU.   Prenatal labs: ABO, Rh: B/Positive/-- (06/03 0912) Antibody: Negative (06/03 0912) Rubella: 4.16 (06/03 0912) RPR: Non Reactive (09/01 1112)  HBsAg: Negative (06/03 0912)  HIV: Non Reactive (09/01 1112)  GBS: Negative/-- (11/14 1047)   Nursing Staff Provider  Office Location  [facility] Dating  U/S  Language  English Anatomy US  Normal anatomy  Flu Vaccine  12/15/20 Genetic/Carrier Screen  NIPS: low risk female AFP:   Negative Horizon: silent carrier alpha thal  TDaP Vaccine   12/15/20 Hgb A1C or  GTT Early  Third trimester  Glucose, Fasting 65 - 91 mg/dL 86   Glucose,  1 hour 65 - 179 mg/dL 129   Glucose, 2 hour 65 - 152 mg/dL 103     COVID Vaccine    LAB RESULTS   Rhogam  B+ Blood Type B/Positive/-- (06/03 0912)   Baby Feeding Plan /Bottle Antibody Negative (06/03 0912)  Contraception IUD @ pp visit Rubella 4.16 (06/03 0912)  Circumcision no RPR Non Reactive (09/01 1112)   Pediatrician  [facility] HBsAg Negative (06/03 0912)   Support Person  FOB HCVAb  Negative 09-01-20  Prenatal Classes Info given HIV Non Reactive (09/01 1112)     BTL Consent  GBS Negative/-- (11/14 1047) (For PCN allergy, check sensitivities)   VBAC Consent n/a Pap ppartum       BP Cuff  Waterbirth  _0  Class _1  Consent _2  CNM visit  PHQ9 & GAD7 [  ] new OB _3  28 weeks - 3/0 [  ] 36 weeks Induction  _4  Orders Entered _5 Foley Y/N  Prenatal Transfer Tool  Maternal Diabetes: No Genetic Screening: Normal Maternal Ultrasounds/Referrals: Normal Fetal Ultrasounds or other Referrals:  None Maternal Substance Abuse:  No Significant Maternal Medications:  None Significant Maternal Lab Results: Group B Strep negative  No results found for this or any previous visit (from the past 24 hour(s)).  Assessment: Jodeen Mclin is a 30 y.o. [id number] with an IUP at 23w3dadmitted for presumed active labor.  Recheck of cx reveals 4/80/-2/posterior.    Plan: #Labor: expectant management>may end up having to DC (L&D full w/people waiting in MAU for a bed) #Pain:  Per request #FWB Cat 1   FShaundrea Carrigg12/05/2020, 5:49 AM

## 2021-03-02 NOTE — MAU Note (Signed)
PT SAYS UC STRONG SINCE 0300 PNC WITH [facility]- VE 2-3 CM

## 2021-03-02 NOTE — Progress Notes (Signed)
Patient ID: Carolyn Perez, female   DOB: 1990/11/29, 31 y.o.   MRN: [id number]  States ctx are becoming more uncomfortable; appears calm  VSS, afebrile FHR 140-150s, +accels, no decels Ctx irreg 3-6 mins Cx 5/90/vtx -2  IUP@39 .3wks Latent phase GBS neg  -Offered AROM for augmentation; she prefers to have an epidural placed first, then plan to AROM afterwards -Anticipate vag del  Arabella Merles CNM 03/02/2021

## 2021-03-02 NOTE — Anesthesia Procedure Notes (Signed)
Epidural Patient location during procedure: OB Start time: 03/02/2021 10:50 AM End time: 03/02/2021 11:00 AM  Staffing Anesthesiologist: Mellody Dance, MD Performed: anesthesiologist   Preanesthetic Checklist Completed: patient identified, IV checked, site marked, risks and benefits discussed, monitors and equipment checked, pre-op evaluation and timeout performed  Epidural Patient position: sitting Prep: DuraPrep Patient monitoring: heart rate, cardiac monitor, continuous pulse ox and blood pressure Approach: midline Location: L2-L3 Injection technique: LOR saline  Needle:  Needle type: Tuohy  Needle gauge: 17 G Needle length: 9 cm Needle insertion depth: 4 cm Catheter type: closed end flexible Catheter size: 20 Guage Catheter at skin depth: 9 cm Test dose: negative and Other  Assessment Events: blood not aspirated, injection not painful, no injection resistance and negative IV test  Additional Notes Informed consent obtained prior to proceeding including risk of failure, 1% risk of PDPH, risk of minor discomfort and bruising.  Discussed rare but serious complications including epidural abscess, permanent nerve injury, epidural hematoma.  Discussed alternatives to epidural analgesia and patient desires to proceed.  Timeout performed pre-procedure verifying patient name, procedure, and platelet count.  Patient tolerated procedure well.

## 2021-03-02 NOTE — Anesthesia Preprocedure Evaluation (Addendum)
Anesthesia Evaluation  Patient identified by MRN, date of birth, ID band Patient awake    Reviewed: Allergy & Precautions, H&P , NPO status , Patient's Chart, lab work & pertinent test results  Airway Mallampati: II  TM Distance: >3 FB Neck ROM: Full    Dental no notable dental hx.    Pulmonary neg pulmonary ROS,    Pulmonary exam normal breath sounds clear to auscultation       Cardiovascular negative cardio ROS Normal cardiovascular exam Rhythm:Regular Rate:Normal     Neuro/Psych negative neurological ROS  negative psych ROS   GI/Hepatic negative GI ROS, Neg liver ROS,   Endo/Other  negative endocrine ROS  Renal/GU negative Renal ROS  negative genitourinary   Musculoskeletal negative musculoskeletal ROS (+)   Abdominal   Peds negative pediatric ROS (+)  Hematology  (+) anemia ,   Anesthesia Other Findings   Reproductive/Obstetrics (+) Pregnancy                             Anesthesia Physical Anesthesia Plan  ASA: 2  Anesthesia Plan: Epidural   Post-op Pain Management:    Induction:   PONV Risk Score and Plan: 2 and Treatment may vary due to age or medical condition  Airway Management Planned: Natural Airway  Additional Equipment: None  Intra-op Plan:   Post-operative Plan:   Informed Consent: I have reviewed the patients History and Physical, chart, labs and discussed the procedure including the risks, benefits and alternatives for the proposed anesthesia with the patient or authorized representative who has indicated his/her understanding and acceptance.       Plan Discussed with: Anesthesiologist  Anesthesia Plan Comments: (Patient stated no need for interpreter)       Anesthesia Quick Evaluation

## 2021-03-02 NOTE — Progress Notes (Signed)
Labor Progress Note Carolyn Perez is a 30 y.o. G3P2002 at [redacted]w[redacted]d presented for SOL  S: Doing well. Just had epidural placed and is comfortable.  O:  BP 115/61   Pulse 76   Temp 98.8 F (37.1 C) (Oral)   Resp 14   Ht 5\' 3"  (1.6 m)   Wt 72.6 kg   LMP 05/30/2020   SpO2 100%   BMI 28.36 kg/m  EFM: 150 bpm/moderate/+accels, no decels  CVE: Dilation: 5 Effacement (%): 90 Station: -2 Presentation: Vertex Exam by:: [email or web address] CNM   A&P: 30 y.o. 34 [redacted]w[redacted]d  #Labor: Progressing well. Attempted AROM but no membrane palpated and fetal hair felt.  #Pain: Epidural #FWB: Cat 1 #GBS negative  [redacted]w[redacted]d, MD 11:44 AM

## 2021-03-02 NOTE — Discharge Summary (Signed)
Postpartum Discharge Summary      Patient Name: Carolyn Perez DOB: 10/23/1990 MRN: [id number]  Date of admission: 03/02/2021 Delivery date:03/02/2021  Delivering provider: Greig Right RUTH  Date of discharge: 03/03/2021  Admitting diagnosis: Encounter for induction of labor [Z34.90] Intrauterine pregnancy: [redacted]w[redacted]d    Secondary diagnosis:  Active Problems:   Alpha thalassemia silent carrier   Vaginal delivery  Additional problems: none    Discharge diagnosis: Term Pregnancy Delivered                                              Post partum procedures: none Augmentation:  none (attempted AROM> no membrane palpated) Complications: None  Hospital course: Onset of Labor With Vaginal Delivery      30y.o. yo [id number] 362w3das admitted in Latent Labor on 03/02/2021. Patient had an uncomplicated labor course as follows:  Membrane Rupture Time/Date: 11:43 AM ,03/02/2021   Delivery Method:Vaginal, Spontaneous  Episiotomy: None  Lacerations:  1st degree;Perineal  Patient had an uncomplicated postpartum course.  She is ambulating, tolerating a regular diet, passing flatus, and urinating well. Patient is discharged home in stable condition on 03/03/21 per her request for early d/c as long as the baby an go as well.  Newborn Data: Birth date:03/02/2021  Birth time:1:56 PM  Gender:Female  Living status:Living  Apgars:9 ,9  Weight:3255 g (7lb 2.8oz)  Magnesium Sulfate received: No BMZ received: No Rhophylac:N/A MMR:N/A T-DaP:Given prenatally Flu: Yes Transfusion:No  Physical exam  Vitals:   03/02/21 1540 03/02/21 1640 03/02/21 1943 03/03/21 0607  BP: 115/81 102/69 105/68 110/66  Pulse: 79 90 70 77  Resp: 14 16 16 17   Temp: 98.3 F (36.8 C) 98.6 F (37 C) 98.5 F (36.9 C) 98.4 F (36.9 C)  TempSrc: Axillary Oral Oral Oral  SpO2: 100% 100% 99% 100%  Weight:      Height:       General: alert and cooperative Lochia: appropriate Uterine Fundus: firm Incision: N/A DVT  Evaluation: No evidence of DVT seen on physical exam. Labs: Lab Results  Component Value Date   WBC 10.0 03/02/2021   HGB 10.8 (L) 03/02/2021   HCT 35.1 (L) 03/02/2021   MCV 79.6 (L) 03/02/2021   PLT 230 03/02/2021   CMP Latest Ref Rng & Units 08/02/2020  Glucose 70 - 99 mg/dL 83  BUN 6 - 20 mg/dL 7  Creatinine 0.44 - 1.00 mg/dL 0.47  Sodium 135 - 145 mmol/L 136  Potassium 3.5 - 5.1 mmol/L 4.1  Chloride 98 - 111 mmol/L 107  CO2 22 - 32 mmol/L 24  Calcium 8.9 - 10.3 mg/dL 8.7(L)   EdFlavia Shippercore: No flowsheet data found.   After visit meds:  Allergies as of 03/03/2021   No Known Allergies      Medication List     STOP taking these medications    Blood Pressure Kit Devi   famotidine 20 MG tablet Commonly known as: Pepcid       TAKE these medications    ibuprofen 600 MG tablet Commonly known as: ADVIL Take 1 tablet (600 mg total) by mouth every 6 (six) hours as needed.   Vitafol FE+ 90-0.6-0.4-200 MG Caps Take 1 tablet by mouth daily.         Discharge home in stable condition Infant Feeding: Breast Infant Disposition:home with mother Discharge instruction: per After  Visit Summary and Postpartum booklet. Activity: Advance as tolerated. Pelvic rest for 6 weeks.  Diet: routine diet Future Appointments: Future Appointments  Date Time Provider [place]  03/07/2021  1:30 PM Nugent, Gerrie Nordmann, NP [place] None  03/09/2021  3:00 PM [facility] US1 [facility] [place]   Follow up Visit:  Myrtis Ser, CNM  P [facility] Admin [facility] Please schedule this patient for Postpartum visit in: 6 weeks with the following provider: Any provider  In-Person  For C/S patients schedule nurse incision check in weeks 2 weeks: no  Low risk pregnancy complicated by: none  Delivery mode:  SVD  Anticipated Birth Control:  IUD  PP Procedures needed: Pap and IUD placement  Schedule Integrated [place] visit: no   03/03/2021 Myrtis Ser, CNM 9:14 AM

## 2021-03-03 MED ORDER — IBUPROFEN 600 MG PO TABS: 600.0000 mg | ORAL_TABLET | Freq: Four times a day (QID) | ORAL | 0 refills | Status: AC | PRN

## 2021-03-03 NOTE — Anesthesia Postprocedure Evaluation (Signed)
Anesthesia Post Note  Patient: Carolyn Perez  Procedure(s) Performed: AN AD HOC LABOR EPIDURAL     Patient location during evaluation: Mother Baby Anesthesia Type: Epidural Level of consciousness: awake and alert, oriented and patient cooperative Pain management: pain level controlled Vital Signs Assessment: post-procedure vital signs reviewed and stable Respiratory status: spontaneous breathing Cardiovascular status: stable Postop Assessment: no headache, epidural receding, patient able to bend at knees and no signs of nausea or vomiting Anesthetic complications: no Comments: Pt. States she is walking.  Pain score 6.    No notable events documented.  Last Vitals:  Vitals:   03/02/21 1943 03/03/21 0607  BP: 105/68 110/66  Pulse: 70 77  Resp: 16 17  Temp: 36.9 C 36.9 C  SpO2: 99% 100%    Last Pain:  Vitals:   03/03/21 0607  TempSrc: Oral  PainSc:    Pain Goal:                   [facility]

## 2021-03-05 ENCOUNTER — Telehealth: Payer: Self-pay

## 2021-03-05 NOTE — Telephone Encounter (Signed)
Transition Care Management Follow-up Telephone Call Date of discharge and from where: 03/03/2021 from [facility] How have you been since you were released from the hospital? Pt stated that she is feeing well and did not have any questions or concerns at this time.  Any questions or concerns? No  Items Reviewed: Did the pt receive and understand the discharge instructions provided? Yes  Medications obtained and verified? Yes  Other? No  Any new allergies since your discharge? No  Dietary orders reviewed? No Do you have support at home? Yes   Functional Questionnaire: (I = Independent and D = Dependent) ADLs: I  Bathing/Dressing- I  Meal Prep- I  Eating- I  Maintaining continence- I  Transferring/Ambulation- I  Managing Meds- I   Follow up appointments reviewed:  PCP Hospital f/u appt confirmed? No  Specialist Hospital f/u appt confirmed? Yes  Scheduled to see [facility] on 04/16/2021 @ 9:15am. Are transportation arrangements needed? No  If their condition worsens, is the pt aware to call PCP or go to the Emergency Dept.? Yes Was the patient provided with contact information for the PCP's office or ED? Yes Was to pt encouraged to call back with questions or concerns? Yes

## 2021-03-07 ENCOUNTER — Encounter: Payer: Medicaid Other | Admitting: Women's Health

## 2021-03-09 ENCOUNTER — Ambulatory Visit: Payer: Medicaid Other

## 2021-03-13 ENCOUNTER — Inpatient Hospital Stay (HOSPITAL_COMMUNITY)
Admission: AD | Admit: 2021-03-13 | Payer: Medicaid Other | Source: Home / Self Care | Admitting: Obstetrics and Gynecology

## 2021-03-13 ENCOUNTER — Inpatient Hospital Stay (HOSPITAL_COMMUNITY): Payer: Medicaid Other

## 2021-03-15 ENCOUNTER — Telehealth (HOSPITAL_COMMUNITY): Payer: Self-pay | Admitting: *Deleted

## 2021-03-15 NOTE — Telephone Encounter (Signed)
Hospital Discharge Follow-Up Call:  Spoke to patient with help of telephonic interpreter "[phone]".  Patient reports that she is doing well overall.  She is having back pain that goes down her leg.  Pain began when she got home.  She can walk and move without difficulty, but she does experience pain.  When asked if she would like help making an appointment with her doctor, she said she was not that worried about it and would call if things got worse.  She says she has a postpartum check up scheduled in January.  She has no other concerns about her healing process.  EPDS today was 8 and she says that she is doing well emotionally.  Patient reports that baby is well and she has no concerns about baby's health.  She says that baby sleeps "in his own bed".  Upon clarification, baby sleeps in a crib.  ABCs of Safe Sleep reviewed.

## 2021-04-16 ENCOUNTER — Other Ambulatory Visit (HOSPITAL_COMMUNITY)
Admission: RE | Admit: 2021-04-16 | Discharge: 2021-04-16 | Disposition: A | Discharge status: Home / Self Care | Payer: Medicaid Other | Source: Ambulatory Visit | Attending: Advanced Practice Midwife | Admitting: Advanced Practice Midwife

## 2021-04-16 ENCOUNTER — Encounter: Payer: Self-pay | Admitting: Advanced Practice Midwife

## 2021-04-16 ENCOUNTER — Ambulatory Visit (INDEPENDENT_AMBULATORY_CARE_PROVIDER_SITE_OTHER): Payer: Medicaid Other | Admitting: Advanced Practice Midwife

## 2021-04-16 ENCOUNTER — Other Ambulatory Visit: Payer: Self-pay

## 2021-04-16 DIAGNOSIS — Z124 Encounter for screening for malignant neoplasm of cervix: Secondary | ICD-10-CM | POA: Diagnosis not present

## 2021-04-16 DIAGNOSIS — Z3042 Encounter for surveillance of injectable contraceptive: Secondary | ICD-10-CM | POA: Diagnosis not present

## 2021-04-16 LAB — POCT URINE PREGNANCY: Preg Test, Ur: NEGATIVE

## 2021-04-16 MED ORDER — MEDROXYPROGESTERONE ACETATE 150 MG/ML IM SUSP: 150.0000 mg | INTRAMUSCULAR | 4 refills | Status: DC

## 2021-04-16 MED ORDER — MEDROXYPROGESTERONE ACETATE 150 MG/ML IM SUSP
150.0000 mg | Freq: Once | INTRAMUSCULAR | Status: AC
  Administered 2021-04-16: 150 mg via INTRAMUSCULAR

## 2021-04-16 NOTE — Progress Notes (Signed)
Post Partum Visit Note  Carolyn Perez is a 31 y.o. [id number] female who presents for a postpartum visit. She is 6 weeks postpartum following a normal spontaneous vaginal delivery.  I have fully reviewed the prenatal and intrapartum course. The delivery was at [redacted]w[redacted]d gestational weeks.  Anesthesia: epidural. Postpartum course has been unremarkable. Baby is doing well. Baby is feeding by bottle - Carolyn Perez . Bleeding no bleeding. Bowel function is normal. Bladder function is normal. Patient is sexually active. Contraception method is none. Postpartum depression screening: negative.   The pregnancy intention screening data noted above was reviewed. Potential methods of contraception were discussed. The patient elected to proceed with No data recorded.   Edinburgh Postnatal Depression Scale - 04/16/21 0949       Edinburgh Postnatal Depression Scale:  In the Past 7 Days   I have been able to laugh and see the funny side of things. 0    I have looked forward with enjoyment to things. 0    I have blamed myself unnecessarily when things went wrong. 0    I have been anxious or worried for no good reason. 0    I have felt scared or panicky for no good reason. 0    Things have been getting on top of me. 0    I have been so unhappy that I have had difficulty sleeping. 0    I have felt sad or miserable. 0    I have been so unhappy that I have been crying. 0    The thought of harming myself has occurred to me. 0    Edinburgh Postnatal Depression Scale Total 0             Health Maintenance Due  Topic Date Due   COVID-19 Vaccine (1) Never done    The following portions of the patient's history were reviewed and updated as appropriate: allergies, current medications, past family history, past medical history, past social history, past surgical history, and problem list.  Review of Systems Pertinent items noted in HPI and remainder of comprehensive ROS otherwise negative.  Objective:  BP  112/77    Pulse 75    Ht 5\' 3"  (1.6 m)    Wt 138 lb (62.6 kg)    Breastfeeding No    BMI 24.45 kg/m    VS reviewed, nursing note reviewed,  Constitutional: well developed, well nourished, no distress HEENT: normocephalic CV: normal rate Pulm/chest wall: normal effort Breast Exam:  Deferred Abdomen: soft Neuro: alert and oriented x 3 Skin: warm, dry Psych: affect normal Pelvic exam: Cervix pink, visually closed, without lesion, scant white creamy discharge, vaginal walls and external genitalia normal  Assessment:   1. Encounter for routine postpartum follow-up --Doing well, bonding well with baby, good support at home - POCT urine pregnancy  2. Screening for cervical cancer  - Cytology - PAP( [place])  3. Encounter for Depo-Provera contraception --Initially wanted IUD, had IUD before but pt husband could feel string.  Will try Depo, consider IUD again, try string long, or cut very short if needed. - medroxyPROGESTERone (DEPO-PROVERA) injection 150 mg - medroxyPROGESTERone (DEPO-PROVERA) 150 MG/ML injection; Inject 1 mL (150 mg total) into the muscle every 3 (three) months.  Dispense: 1 mL; Refill: 4   Plan:   Essential components of care per ACOG recommendations:  1.  Mood and well being: Patient with negative depression screening today. Reviewed local resources for support.  - Patient tobacco use? No.   -  hx of drug use? No.    2. Infant care and feeding:  -Patient currently breastmilk feeding? No.  -Social determinants of health (SDOH) reviewed in EPIC. No concerns    3. Sexuality, contraception and birth spacing - Patient does not want a pregnancy in the next year.   - Reviewed forms of contraception in tiered fashion. Patient desired Depo-Provera today.   - Discussed birth spacing of 18 months  4. Sleep and fatigue -Encouraged family/partner/community support of 4 hrs of uninterrupted sleep to help with mood and fatigue  5. Physical Recovery  - Discussed  patients delivery and complications. She describes her labor as good. - Patient had a Vaginal, no problems at delivery. Patient had a 1st degree laceration. Perineal healing reviewed. Patient expressed understanding - Patient has urinary incontinence? No. - Patient is safe to resume physical and sexual activity  6.  Health Maintenance - HM due items addressed Yes - Last pap smear No results found for: DIAGPAP Pap smear done at today's visit.  -Breast Cancer screening indicated? No.   7. Chronic Disease/Pregnancy Condition follow up: None  - PCP follow up  Sharen Counter, CNM [facility] [employer], [facility]

## 2021-04-18 LAB — CYTOLOGY - PAP
Comment: NEGATIVE
Diagnosis: NEGATIVE
High risk HPV: NEGATIVE

## 2021-07-16 ENCOUNTER — Ambulatory Visit: Payer: Medicaid Other

## 2021-07-20 ENCOUNTER — Ambulatory Visit (INDEPENDENT_AMBULATORY_CARE_PROVIDER_SITE_OTHER): Payer: Medicaid Other

## 2021-07-20 DIAGNOSIS — Z3042 Encounter for surveillance of injectable contraceptive: Secondary | ICD-10-CM | POA: Diagnosis not present

## 2021-07-20 LAB — POCT URINE PREGNANCY: Preg Test, Ur: NEGATIVE

## 2021-07-20 MED ORDER — MEDROXYPROGESTERONE ACETATE 150 MG/ML IM SUSP
150.0000 mg | INTRAMUSCULAR | Status: AC
  Administered 2021-07-20 – 2024-04-20 (×5): 150 mg via INTRAMUSCULAR

## 2021-07-20 NOTE — Progress Notes (Addendum)
Pt is in the office for depo injection, 4 days late. UPT is negative today. Administered depo in RUOQ and pt tolerated well. Next due July 7-21 ?Marland KitchenMarland Kitchen ?Administrations This Visit   ? ? medroxyPROGESTERone (DEPO-PROVERA) injection 150 mg   ? ? Admin Date ?07/20/2021 Action ?Given Dose ?150 mg Route ?Intramuscular Administered By ?Hinton Lovely, RN  ? ?  ?  ? ?  ?  ? ?Tristan Schroeder, RN ? ? ?Patient was assessed and managed by nursing staff during this encounter. I have reviewed the chart and agree with the documentation and plan. I have also made any necessary editorial changes. ? ?Verita Schneiders, MD ?07/20/2021 11:12 AM  ? ?

## 2021-10-08 ENCOUNTER — Ambulatory Visit (INDEPENDENT_AMBULATORY_CARE_PROVIDER_SITE_OTHER): Payer: Medicaid Other

## 2021-10-08 VITALS — BP 118/80 | HR 69 | Ht 63.0 in | Wt 135.0 lb

## 2021-10-08 DIAGNOSIS — Z3042 Encounter for surveillance of injectable contraceptive: Secondary | ICD-10-CM

## 2021-10-08 MED ORDER — MEDROXYPROGESTERONE ACETATE 150 MG/ML IM SUSP
150.0000 mg | Freq: Once | INTRAMUSCULAR | Status: AC
  Administered 2021-10-08: 150 mg via INTRAMUSCULAR

## 2021-10-08 NOTE — Progress Notes (Signed)
SUBJECTIVE: Carolyn Perez is a 31 y.o. female who presents for DEPO Injection.   OBJECTIVE: Appears well, in no apparent distress.  Vital signs are normal.   ASSESSMENT: Need for BC.  Last PAP 04/16/2021  PLAN: DEPO Injection given in RUOQ, tolerated well.  Next DEPO due Sept. 25 - Oct. 9, 2023  Administrations This Visit     medroxyPROGESTERone (DEPO-PROVERA) injection 150 mg     Admin Date 10/08/2021 Action Given Dose 150 mg Route Intramuscular Administered By Maretta Bees, RMA

## 2021-10-23 NOTE — Progress Notes (Signed)
Patient was assessed and managed by nursing staff during this encounter. I have reviewed the chart and agree with the documentation and plan. I have also made any necessary editorial changes.  Scheryl Darter, MD 10/23/2021 3:00 PM

## 2021-12-27 ENCOUNTER — Ambulatory Visit (INDEPENDENT_AMBULATORY_CARE_PROVIDER_SITE_OTHER): Payer: Medicaid Other | Admitting: *Deleted

## 2021-12-27 VITALS — BP 123/86 | HR 67

## 2021-12-27 DIAGNOSIS — Z3042 Encounter for surveillance of injectable contraceptive: Secondary | ICD-10-CM | POA: Diagnosis not present

## 2021-12-27 MED ORDER — MEDROXYPROGESTERONE ACETATE 150 MG/ML IM SUSP
150.0000 mg | Freq: Once | INTRAMUSCULAR | Status: AC
  Administered 2021-12-27: 150 mg via INTRAMUSCULAR

## 2021-12-27 NOTE — Progress Notes (Signed)
Date last pap: 04/16/21. Last Depo-Provera: 10/08/21. Side Effects if any: NA. Serum HCG indicated? NA. Depo-Provera 150 mg IM given by: Lillia Abed. Lazarus Salines, RNC in . Next appointment due 03/14/22-03/28/22.

## 2021-12-28 NOTE — Progress Notes (Signed)
Patient was assessed and managed by nursing staff during this encounter. I have reviewed the chart and agree with the documentation and plan. I have also made any necessary editorial changes.  Lonza Shimabukuro A Quantavius Humm, MD 12/28/2021 9:26 AM   

## 2021-12-31 ENCOUNTER — Ambulatory Visit (INDEPENDENT_AMBULATORY_CARE_PROVIDER_SITE_OTHER): Payer: Medicaid Other | Admitting: Obstetrics and Gynecology

## 2021-12-31 ENCOUNTER — Encounter: Payer: Self-pay | Admitting: Obstetrics and Gynecology

## 2021-12-31 VITALS — BP 118/80 | HR 61 | Wt 132.0 lb

## 2021-12-31 DIAGNOSIS — Z3009 Encounter for other general counseling and advice on contraception: Secondary | ICD-10-CM | POA: Diagnosis not present

## 2021-12-31 NOTE — Progress Notes (Signed)
31 yo P3 with BMI 23 and amenorrhea secondary to depo-provera presenting today for contraception counseling. Patient has previously used the Mirena IUD which worked well for her but her husband complained that he could feel the strings. She is interested in having the IUD again. Patient is without any other complaints  Past Medical History:  Diagnosis Date   Medical history non-contributory    Past Surgical History:  Procedure Laterality Date   NO PAST SURGERIES     Family History  Problem Relation Age of Onset   Diabetes Neg Hx    Hypertension Neg Hx    Social History   Tobacco Use   Smoking status: Never   Smokeless tobacco: Never  Vaping Use   Vaping Use: Never used  Substance Use Topics   Alcohol use: Never   Drug use: Never   ROS See pertinent in HPI. All other systems reviewed and non contributory Blood pressure 118/80, pulse 61, weight 132 lb (59.9 kg), not currently breastfeeding.  GENERAL: Well-developed, well-nourished female in no acute distress.  NEURO: alert and oriented x 3  A/P 31 yo P3 here for contraception counseling - All contraception options reviewed with the patient including BTL. Patient is interested in [place] but due to child care constraints will not be able to have it scheduled. - All questions were answered - Patient will return for IUD insertion after discussing with her husband - Will continue with depo-provera for now - Patient with normal pap smear 04/2021 - A total of 15 minutes was spent counseling the patient

## 2021-12-31 NOTE — Progress Notes (Signed)
Pt is in office to discuss [facility] options.  Pt is currently on Depo - last injection was last week.

## 2022-03-18 ENCOUNTER — Ambulatory Visit (INDEPENDENT_AMBULATORY_CARE_PROVIDER_SITE_OTHER): Payer: Medicaid Other

## 2022-03-18 DIAGNOSIS — Z3042 Encounter for surveillance of injectable contraceptive: Secondary | ICD-10-CM

## 2022-03-18 NOTE — Progress Notes (Signed)
Pt is in the office for depo injection. Administered in RUOQ per pt request, and pt tolerated well. Next due March 5-19. .. Administrations This Visit     medroxyPROGESTERone (DEPO-PROVERA) injection 150 mg     Admin Date 03/18/2022 Action Given Dose 150 mg Route Intramuscular Administered By Katrina Stack, RN

## 2022-06-10 ENCOUNTER — Other Ambulatory Visit: Payer: Self-pay | Admitting: *Deleted

## 2022-06-10 ENCOUNTER — Other Ambulatory Visit: Payer: Self-pay | Admitting: Emergency Medicine

## 2022-06-10 DIAGNOSIS — Z3042 Encounter for surveillance of injectable contraceptive: Secondary | ICD-10-CM

## 2022-06-10 MED ORDER — MEDROXYPROGESTERONE ACETATE 150 MG/ML IM SUSP: 150.0000 mg | INTRAMUSCULAR | 0 refills | Status: DC

## 2022-06-10 NOTE — Progress Notes (Signed)
RX Depo sent for appt 06/11/22. Pt is overdue for annual 04/23/22. [place] office to call pt to schedule annual.

## 2022-06-10 NOTE — Progress Notes (Signed)
Rx refill for DEPO per pt request.

## 2022-06-11 ENCOUNTER — Ambulatory Visit (INDEPENDENT_AMBULATORY_CARE_PROVIDER_SITE_OTHER): Payer: Self-pay | Admitting: Emergency Medicine

## 2022-06-11 VITALS — BP 134/83 | HR 83 | Ht 63.0 in | Wt 127.6 lb

## 2022-06-11 DIAGNOSIS — Z3042 Encounter for surveillance of injectable contraceptive: Secondary | ICD-10-CM

## 2022-06-11 MED ORDER — MEDROXYPROGESTERONE ACETATE 150 MG/ML IM SUSY
150.0000 mg | PREFILLED_SYRINGE | Freq: Once | INTRAMUSCULAR | Status: AC
  Administered 2022-06-11: 150 mg via INTRAMUSCULAR

## 2022-06-11 NOTE — Progress Notes (Signed)
Date last pap: 04/16/2021. Last Depo-Provera: 03/19/2023. Side Effects if any: Feeling cold, headaches. Serum HCG indicated? NA. Depo-Provera 150 mg IM given by: Corinna Lines, RN into RUOQ, tolerated well.  Next appointment due: May 28- Jun 11

## 2022-08-27 IMAGING — US US MFM OB FOLLOW-UP
1 series · 14 of 28 positions shown · non-contrast
Comparison: none

[Series 1: us mfm ob follow-up · 36 acquisitions, 14 frames shown]
[im 2/36]
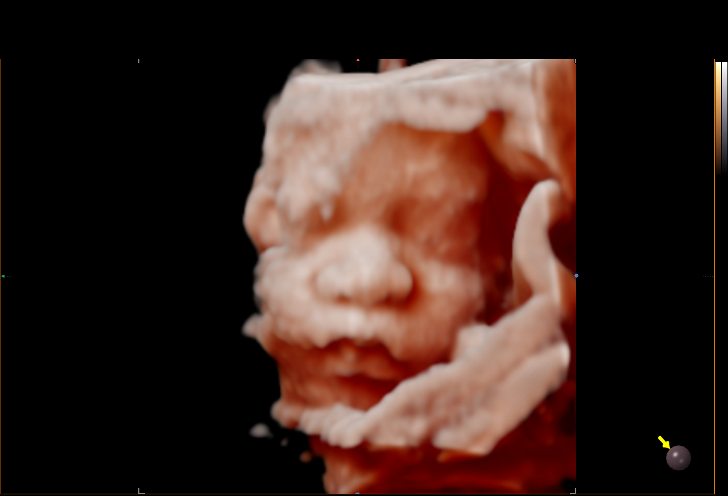
[im 4/36]
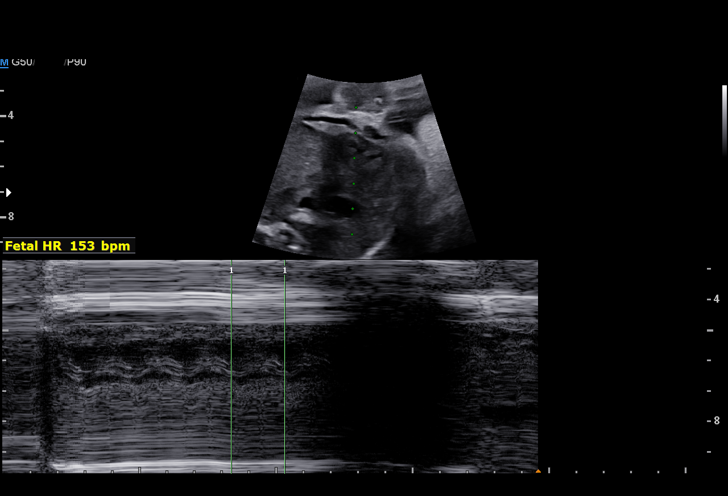
[im 7/36]
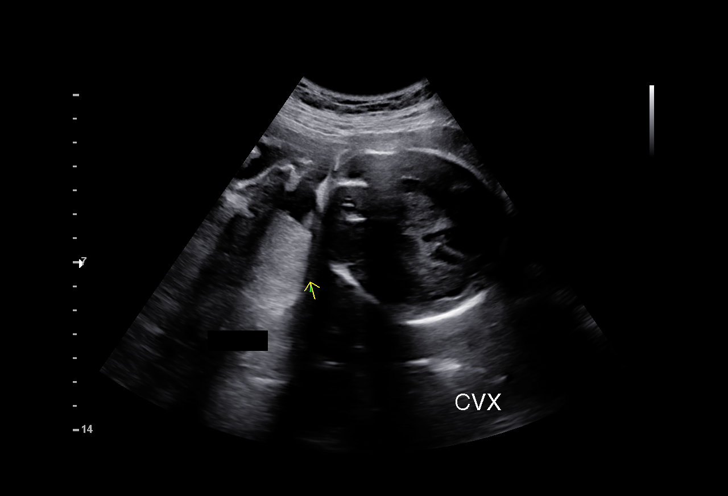
[im 10/36]
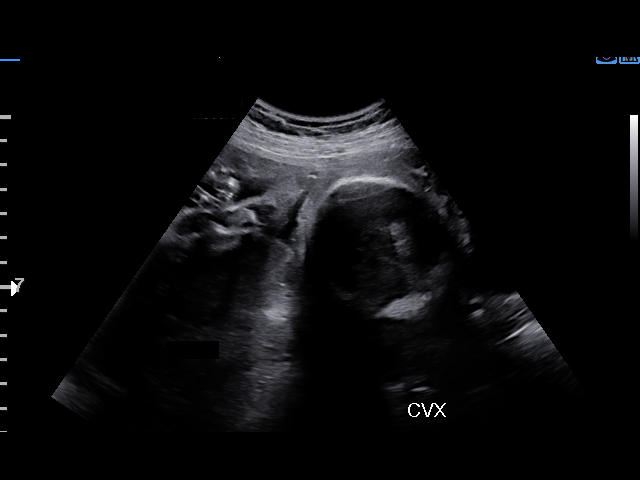
[im 12/36]
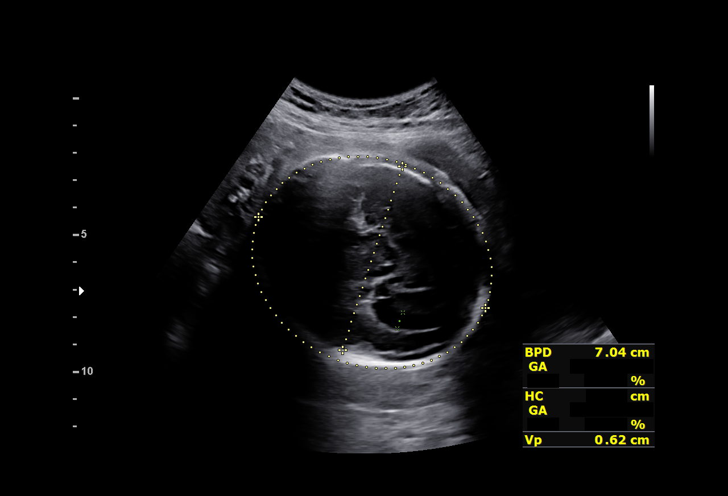
[im 15/36]
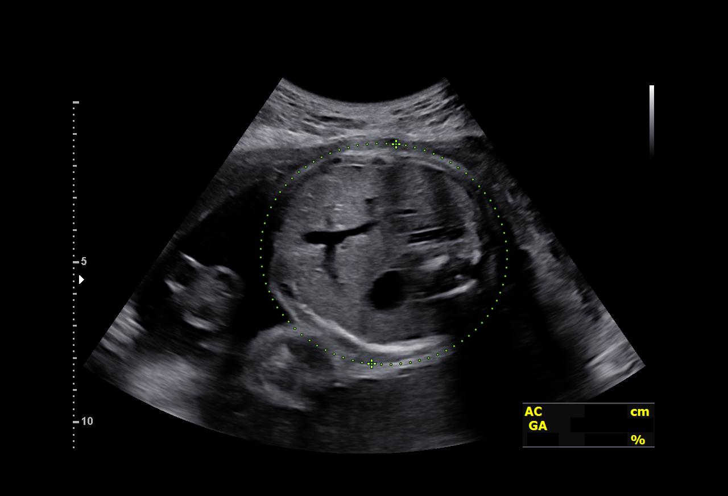
[im 17/36]
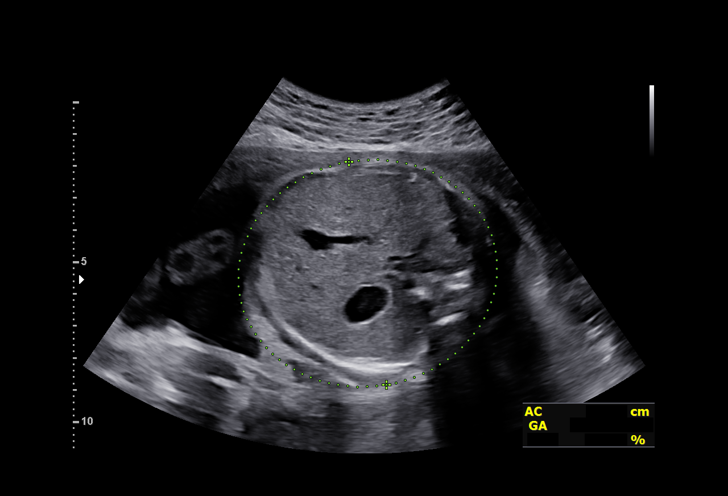
[im 20/36]
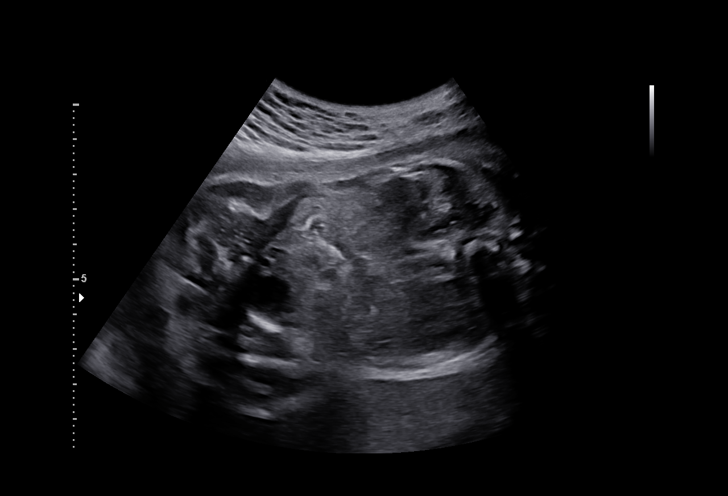
[im 23/36]
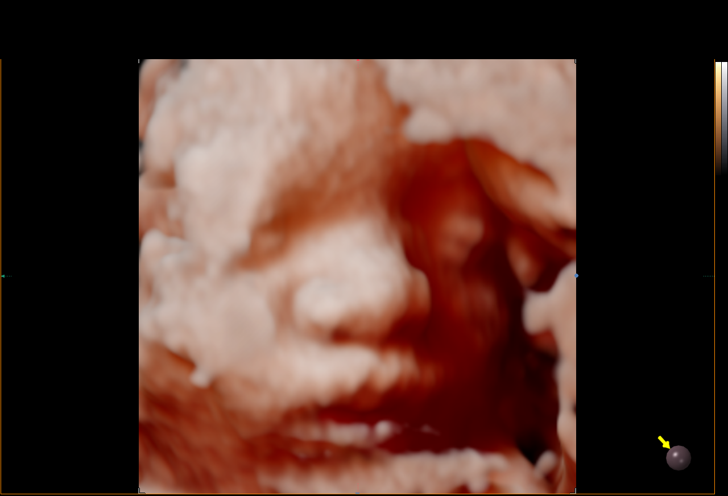
[im 25/36]
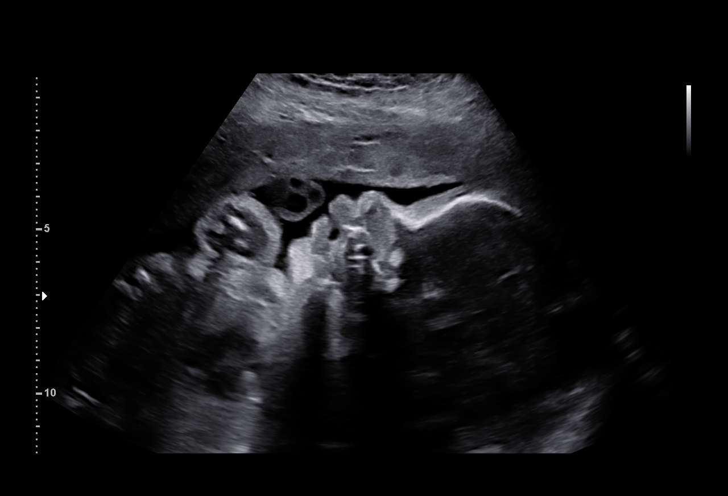
[im 28/36]
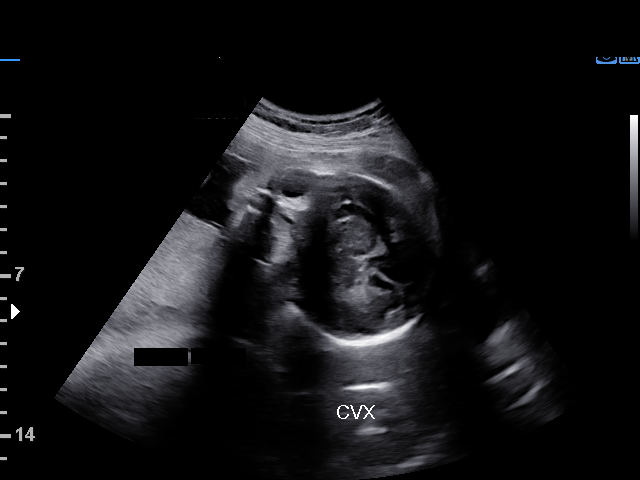
[im 30/36]
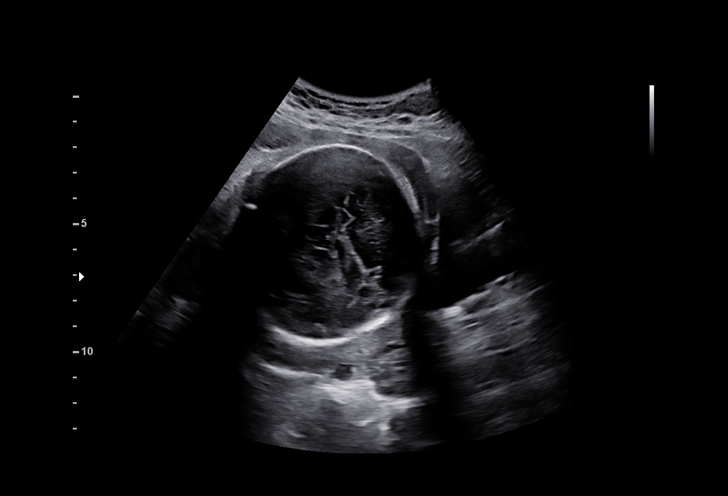
[im 33/36]
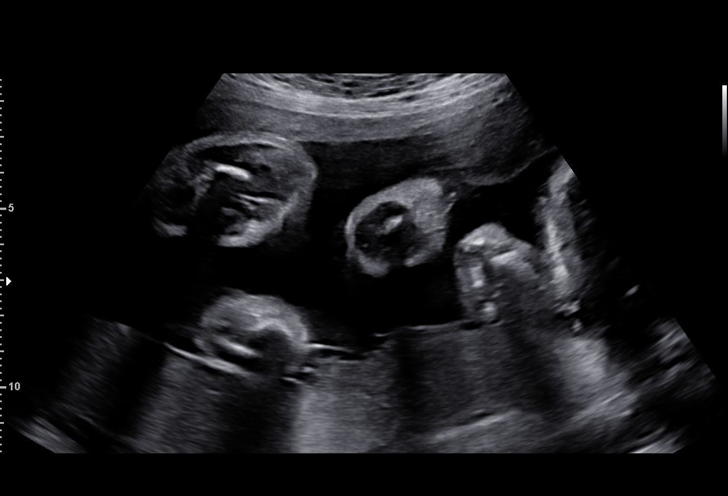
[im 36/36]
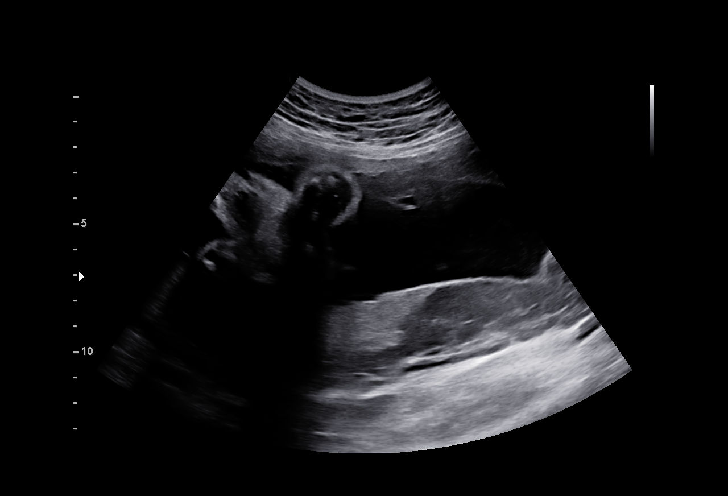

[14 of 28 positions shown; findings below may reference images not displayed]

MAGDI

                                                      NASTEXO

Indications

 Low lying placenta, antepartum
 28 weeks gestation of pregnancy
 Genetic carrier (Silent Shiann Manansala)
 Encounter for antenatal screening for
 malformations
 LR NIPS/ Negative AFP
Fetal Evaluation

 Num Of Fetuses:         1
 Fetal Heart Rate(bpm):  153
 Cardiac Activity:       Observed
 Presentation:           Cephalic
 Placenta:               Posterior
 P. Cord Insertion:      Previously Visualized

 Amniotic Fluid
 AFI FV:      Within normal limits

 AFI Sum(cm)     %Tile       Largest Pocket(cm)
 13.22           38

 RUQ(cm)       RLQ(cm)       LUQ(cm)        LLQ(cm)

Biometry

 BPD:      70.4  mm     G. Age:  28w 2d         47  %    CI:         72.3   %    70 - 86
                                                         FL/HC:      20.2   %    18.8 -
 HC:      263.4  mm     G. Age:  28w 5d         39  %    HC/AC:      1.13        1.05 -
 AC:      233.8  mm     G. Age:  27w 5d         33  %    FL/BPD:     75.6   %    71 - 87
 FL:       53.2  mm     G. Age:  28w 2d         42  %    FL/AC:      22.8   %    20 - 24
 LV:        6.2  mm

 Est. FW:    9994  gm      2 lb 9 oz     38  %
OB History

 Blood Type:   B+
 Gravidity:    3         Term:   2        Prem:   0        SAB:   0
 TOP:          0       Ectopic:  0        Living: 2
Gestational Age

 LMP:           28w 0d        Date:  05/30/20                 EDD:   03/06/21
 U/S Today:     28w 2d                                        EDD:   03/04/21
 Best:          28w 0d     Det. By:  LMP  (05/30/20)          EDD:   03/06/21
Anatomy

 Cranium:               Appears normal         Aortic Arch:            Previously seen
 Cavum:                 Previously seen        Ductal Arch:            Previously seen
 Ventricles:            Appears normal         Diaphragm:              Previously seen
 Choroid Plexus:        Previously seen        Stomach:                Appears normal, left
                                                                       sided
 Cerebellum:            Previously seen        Abdomen:                Previously seen
 Posterior Fossa:       Previously seen        Abdominal Wall:         Previously seen
 Nuchal Fold:           Not applicable (>20    Cord Vessels:           Previously seen
                        wks GA)
 Face:                  Orbits and profile     Kidneys:                Appear normal
                        previously seen
 Lips:                  Previously seen        Bladder:                Appears normal
 Thoracic:              Previously seen        Spine:                  Previously seen
 Heart:                 Appears normal         Upper Extremities:      Previously seen
                        (4CH, axis, and
                        situs)
 RVOT:                  Previously seen        Lower Extremities:      Previously seen
 LVOT:                  Previously seen

 Other:  Fetus appears to be a male. Heels and Open hands prev visualized.
         3VV and 3VTV prev visualized.
Cervix Uterus Adnexa

 Cervix
 Length:           3.83  cm.
 Normal appearance by transabdominal scan.

 Right Ovary
 Not visualized.
 Left Ovary
 Not visualized.
Comments

 This patient was seen for a follow up exam as a possible low-
 lying placenta was noted on her last ultrasound exam.  She
 denies any problems since her last exam.
 She was informed that the fetal growth and amniotic fluid
 level appears appropriate for her gestational age.
 A normal-appearing posterior placenta was noted today.
 There were no signs of a low-lying placenta.
 Follow-up as indicated

## 2022-09-01 ENCOUNTER — Other Ambulatory Visit: Payer: Self-pay | Admitting: Obstetrics and Gynecology

## 2022-09-01 DIAGNOSIS — Z3042 Encounter for surveillance of injectable contraceptive: Secondary | ICD-10-CM

## 2022-09-03 ENCOUNTER — Ambulatory Visit (INDEPENDENT_AMBULATORY_CARE_PROVIDER_SITE_OTHER): Payer: Self-pay

## 2022-09-03 VITALS — BP 112/77 | HR 87

## 2022-09-03 DIAGNOSIS — Z3042 Encounter for surveillance of injectable contraceptive: Secondary | ICD-10-CM

## 2022-09-03 NOTE — Progress Notes (Signed)
Pt is in the office for depo injection. Administered in RUOQ per pt request, and pt tolerated well. Next due Aug 20- Sept 3. .. Administrations This Visit     medroxyPROGESTERone (DEPO-PROVERA) injection 150 mg     Admin Date 09/03/2022 Action Given Dose 150 mg Route Intramuscular Administered By Katrina Stack, RN

## 2022-09-09 MED ORDER — MEDROXYPROGESTERONE ACETATE 150 MG/ML IM SUSP: 150.0000 mg | INTRAMUSCULAR | 0 refills | Status: DC

## 2022-11-23 ENCOUNTER — Other Ambulatory Visit: Payer: Self-pay | Admitting: Obstetrics and Gynecology

## 2022-11-23 DIAGNOSIS — Z3042 Encounter for surveillance of injectable contraceptive: Secondary | ICD-10-CM

## 2022-11-25 MED ORDER — MEDROXYPROGESTERONE ACETATE 150 MG/ML IM SUSP: 150.0000 mg | INTRAMUSCULAR | 0 refills | Status: DC

## 2022-12-03 ENCOUNTER — Ambulatory Visit (INDEPENDENT_AMBULATORY_CARE_PROVIDER_SITE_OTHER): Payer: Self-pay | Admitting: Emergency Medicine

## 2022-12-03 VITALS — BP 121/85 | HR 84 | Ht 63.0 in | Wt 124.4 lb

## 2022-12-03 DIAGNOSIS — Z3042 Encounter for surveillance of injectable contraceptive: Secondary | ICD-10-CM

## 2022-12-03 MED ORDER — MEDROXYPROGESTERONE ACETATE 150 MG/ML IM SUSP
150.0000 mg | Freq: Once | INTRAMUSCULAR | Status: AC
Start: 2022-12-03 — End: 2022-12-03
  Administered 2022-12-03: 150 mg via INTRAMUSCULAR

## 2022-12-03 NOTE — Progress Notes (Signed)
Pt presents for Depo Injection.  Date last pap: 04/16/2021.  Last Depo-Provera: 09/03/2022.  Side Effects if any: Pt reports that she becomes extremely cold after she receives injections, and the feeling persists. States that she is always colder than others around her. Pt advised to schedule apt with provider to discuss possible anemia or other dx.  Serum HCG indicated? N/A.  Depo-Provera 150 mg IM given by: Resa Miner into RUOQ, tolerated well in office.  Next appointment due Nov 19th-Dec 3rd.

## 2023-02-21 ENCOUNTER — Other Ambulatory Visit: Payer: Self-pay | Admitting: Obstetrics and Gynecology

## 2023-02-21 DIAGNOSIS — Z3042 Encounter for surveillance of injectable contraceptive: Secondary | ICD-10-CM

## 2023-02-21 MED ORDER — MEDROXYPROGESTERONE ACETATE 150 MG/ML IM SUSP: 150.0000 mg | INTRAMUSCULAR | 0 refills | Status: DC

## 2023-02-25 ENCOUNTER — Ambulatory Visit (INDEPENDENT_AMBULATORY_CARE_PROVIDER_SITE_OTHER): Payer: Self-pay | Admitting: Emergency Medicine

## 2023-02-25 VITALS — BP 124/85 | HR 76 | Ht 63.0 in | Wt 130.0 lb

## 2023-02-25 DIAGNOSIS — Z3042 Encounter for surveillance of injectable contraceptive: Secondary | ICD-10-CM

## 2023-02-25 MED ORDER — MEDROXYPROGESTERONE ACETATE 150 MG/ML IM SUSP
150.0000 mg | Freq: Once | INTRAMUSCULAR | Status: AC
Start: 2023-02-25 — End: 2023-02-25
  Administered 2023-02-25: 150 mg via INTRAMUSCULAR

## 2023-02-25 NOTE — Progress Notes (Signed)
Date last pap: 04/16/2021. Last Depo-Provera: 12/03/2022. Side Effects if any: NA. Serum HCG indicated? NA. Depo-Provera 150 mg IM given by: Resa Miner, RN into RUOQ, tolerated well in office. Next appointment due Feb 11th -Feb25th.

## 2023-05-12 ENCOUNTER — Other Ambulatory Visit: Payer: Self-pay | Admitting: Obstetrics and Gynecology

## 2023-05-12 DIAGNOSIS — Z3042 Encounter for surveillance of injectable contraceptive: Secondary | ICD-10-CM

## 2023-05-13 MED ORDER — MEDROXYPROGESTERONE ACETATE 150 MG/ML IM SUSP: 150.0000 mg | INTRAMUSCULAR | 0 refills | Status: DC

## 2023-05-19 ENCOUNTER — Ambulatory Visit (INDEPENDENT_AMBULATORY_CARE_PROVIDER_SITE_OTHER): Payer: Self-pay

## 2023-05-19 VITALS — BP 119/73 | HR 76 | Wt 129.0 lb

## 2023-05-19 DIAGNOSIS — Z3042 Encounter for surveillance of injectable contraceptive: Secondary | ICD-10-CM

## 2023-05-19 MED ORDER — MEDROXYPROGESTERONE ACETATE 150 MG/ML IM SUSP
150.0000 mg | Freq: Once | INTRAMUSCULAR | Status: AC
Start: 2023-05-19 — End: 2023-05-19
  Administered 2023-05-19: 150 mg via INTRAMUSCULAR

## 2023-05-19 NOTE — Progress Notes (Signed)
Date last pap: 04/16/21. Pt due for AEX.  Last Depo-Provera: 02/25/23. Side Effects if any: NA. Serum HCG indicated? NA. Depo-Provera 150 mg IM given by: Karma Ganja, RN. Tolerated well.  Next appointment due May 5th-May 19th 2025.

## 2023-08-09 ENCOUNTER — Other Ambulatory Visit: Payer: Self-pay | Admitting: Obstetrics and Gynecology

## 2023-08-09 DIAGNOSIS — Z3042 Encounter for surveillance of injectable contraceptive: Secondary | ICD-10-CM

## 2023-08-11 ENCOUNTER — Ambulatory Visit: Payer: Self-pay

## 2023-08-11 ENCOUNTER — Other Ambulatory Visit: Payer: Self-pay

## 2023-08-11 ENCOUNTER — Other Ambulatory Visit: Payer: Self-pay | Admitting: Obstetrics and Gynecology

## 2023-08-11 DIAGNOSIS — Z3042 Encounter for surveillance of injectable contraceptive: Secondary | ICD-10-CM

## 2023-08-11 MED ORDER — MEDROXYPROGESTERONE ACETATE 150 MG/ML IM SUSP: 150.0000 mg | INTRAMUSCULAR | 0 refills | Status: DC

## 2023-08-11 NOTE — Progress Notes (Signed)
 Refill sent on depo injection. Pt had appt today, but will reschedule before 5/19

## 2023-08-12 ENCOUNTER — Ambulatory Visit: Payer: Self-pay

## 2023-08-15 ENCOUNTER — Ambulatory Visit (INDEPENDENT_AMBULATORY_CARE_PROVIDER_SITE_OTHER): Payer: Self-pay

## 2023-08-15 VITALS — BP 111/74 | HR 74 | Wt 131.0 lb

## 2023-08-15 DIAGNOSIS — Z3042 Encounter for surveillance of injectable contraceptive: Secondary | ICD-10-CM

## 2023-08-15 MED ORDER — MEDROXYPROGESTERONE ACETATE 150 MG/ML IM SUSP
150.0000 mg | Freq: Once | INTRAMUSCULAR | Status: AC
Start: 2023-08-15 — End: 2023-08-15
  Administered 2023-08-15: 150 mg via INTRAMUSCULAR

## 2023-08-15 NOTE — Progress Notes (Signed)
 Date last pap: 04/16/21. Last Depo-Provera : 05/19/23. Side Effects if any: NA. Serum HCG indicated? NA. Depo-Provera  150 mg IM given by: Artemio Bilberry, RN. Next appointment due August 1-15.

## 2023-10-27 ENCOUNTER — Other Ambulatory Visit: Payer: Self-pay

## 2023-10-27 DIAGNOSIS — Z3042 Encounter for surveillance of injectable contraceptive: Secondary | ICD-10-CM

## 2023-10-27 MED ORDER — MEDROXYPROGESTERONE ACETATE 150 MG/ML IM SUSP: 150.0000 mg | INTRAMUSCULAR | 0 refills | Status: DC

## 2023-10-31 ENCOUNTER — Ambulatory Visit: Payer: Self-pay

## 2023-11-03 ENCOUNTER — Ambulatory Visit (INDEPENDENT_AMBULATORY_CARE_PROVIDER_SITE_OTHER): Payer: Self-pay

## 2023-11-03 VITALS — BP 110/73 | HR 76 | Ht 63.0 in | Wt 130.0 lb

## 2023-11-03 DIAGNOSIS — Z3042 Encounter for surveillance of injectable contraceptive: Secondary | ICD-10-CM

## 2023-11-03 MED ORDER — MEDROXYPROGESTERONE ACETATE 150 MG/ML IM SUSP
150.0000 mg | Freq: Once | INTRAMUSCULAR | Status: AC
  Administered 2023-11-03: 150 mg via INTRAMUSCULAR

## 2023-11-03 NOTE — Progress Notes (Signed)
 Date last : 04/16/2021. Last Depo-Provera : 08/15/2023. Side Effects if any: NONE. Serum HCG indicated? NA . Depo-Provera  150 mg IM given by: Niels Peels, RMA in LD, tolerated well.  Pt needs Annual Exam, last AEX 04/16/21.  Next appointment due Oct. 20 - Nov. 3, 2025  Administrations This Visit     medroxyPROGESTERone  (DEPO-PROVERA ) injection 150 mg     Admin Date 11/03/2023 Action Given Dose 150 mg Route Intramuscular Documented By Peels Niels PARAS, RMA

## 2023-12-15 ENCOUNTER — Encounter: Payer: Self-pay | Admitting: Advanced Practice Midwife

## 2023-12-15 ENCOUNTER — Ambulatory Visit (INDEPENDENT_AMBULATORY_CARE_PROVIDER_SITE_OTHER): Payer: Self-pay | Admitting: Advanced Practice Midwife

## 2023-12-15 VITALS — BP 122/90 | HR 48 | Ht 63.0 in | Wt 135.0 lb

## 2023-12-15 DIAGNOSIS — Z3042 Encounter for surveillance of injectable contraceptive: Secondary | ICD-10-CM

## 2023-12-15 DIAGNOSIS — Z01419 Encounter for gynecological examination (general) (routine) without abnormal findings: Secondary | ICD-10-CM

## 2023-12-15 DIAGNOSIS — R6889 Other general symptoms and signs: Secondary | ICD-10-CM

## 2023-12-15 NOTE — Progress Notes (Addendum)
   Subjective:     Carolyn Perez is a 33 y.o. female here at [facility] for a routine exam. Current complaints: feeling cold.  Do you have a primary care provider? No  Flowsheet Row Office Visit from 12/15/2023 in [facility] Total Score 0   Health Maintenance Due  Topic Date Due   Hepatitis B Vaccines 19-59 Average Risk (1 of 3 - 19+ 3-dose series) Never done   HPV VACCINES (1 - 3-dose SCDM series) Never done   Influenza Vaccine  10/31/2023   COVID-19 Vaccine (1 - 2024-25 season) Never done    Gynecologic History No LMP recorded (approximate). Patient has had an injection. Contraception: Depo-Provera  injections Last Pap: NILM, neg HPV (04/16/21) Last mammogram: NA  Obstetric History OB History  Gravida Para Term Preterm AB Living  3 3 3  0 0 3  SAB IAB Ectopic Multiple Live Births  0 0 0 0 3    # Outcome Date GA Lbr Len/2nd Weight Sex Type Anes PTL Lv  3 Term 03/02/21 [redacted]w[redacted]d 10:44 / 00:12 3255 g M Vag-Spont EPI  LIV  2 Term 11/25/19     Vag-Spont   LIV  1 Term 11/27/15     Vag-Spont   LIV   Review of Systems Pertinent items are noted in HPI.    Objective:   Today's Vitals   12/15/23 1305  BP: (!) 122/90  Pulse: (!) 48  Weight: 61.2 kg  Height: 5' 3 (1.6 m)  PainSc: 0-No pain   Body mass index is 23.91 kg/m.  VS reviewed, nursing note reviewed Constitutional: well developed, well nourished, no distress HEENT: normocephalic, thyroid without enlargement or mass HEART: RRR, no murmurs rubs/gallops RESP: clear and equal to auscultation bilaterally in all lobes  Breast Exam: Right breast normal without mass, skin or nipple changes or axillary nodes, left breast normal without mass, skin or nipple changes or axillary nodes Abdomen: soft, non-tender Neuro: alert and oriented x 3 Skin: warm, dry Psych: affect normal Pelvic exam: deferred Bimanual exam: Cervix firm, neg CMT, uterus nontender, nonenlarged, adnexa without  tenderness, enlargement, or mass    Assessment/Plan:   1. Well woman exam with routine gynecological exam (Primary) - Physical exam complete, see above - Declines STI screening - No history of abnormal Pap, may wait until 2028 for repeat - BP elevated today without repeat, follow-up at nurse visit for Depo  2. Surveillance for Depo-Provera  contraception - No breakthrough bleeding, plans to continue for contraception - Return for Depo Provera  injection as scheduled  3. Sensation of feeling cold - Reports feeling cold frequently, she is concerned she is iron deficient  - No insurance, plans to complete [place] financial assistance application prior to having labs collected  - TSH - CBC   Return in about 1 month (around 01/14/2024).   Vernell FORBES Ruddle, Student-MidWife 7:23 PM    Midwife Attestation:  I personally saw and evaluated the patient, performing the key elements of the service. I developed and verified the management plan that is described in the resident's/student's note, and I agree with the content with my edits above. VSS, HRR&R, Resp unlabored, Legs neg.    Olam Boards, CNM 10:49 PM

## 2023-12-15 NOTE — Progress Notes (Signed)
 Last period 1 year ago. Due next month for Depo. Denies concerns today. Declines STI testing.

## 2024-01-13 ENCOUNTER — Other Ambulatory Visit: Payer: Self-pay

## 2024-01-13 DIAGNOSIS — Z3042 Encounter for surveillance of injectable contraceptive: Secondary | ICD-10-CM

## 2024-01-13 MED ORDER — MEDROXYPROGESTERONE ACETATE 150 MG/ML IM SUSP: 150.0000 mg | INTRAMUSCULAR | 0 refills | Status: DC

## 2024-01-14 ENCOUNTER — Telehealth: Payer: Self-pay

## 2024-01-14 NOTE — Telephone Encounter (Signed)
 Returned call to advise rx for depo sent. No answer, left vm

## 2024-01-20 ENCOUNTER — Ambulatory Visit (INDEPENDENT_AMBULATORY_CARE_PROVIDER_SITE_OTHER): Payer: Self-pay

## 2024-01-20 DIAGNOSIS — Z3042 Encounter for surveillance of injectable contraceptive: Secondary | ICD-10-CM

## 2024-01-20 MED ORDER — MEDROXYPROGESTERONE ACETATE 150 MG/ML IM SUSP: 150.0000 mg | INTRAMUSCULAR | 1 refills | Status: AC

## 2024-01-20 NOTE — Progress Notes (Signed)
 Pt is in the office for depo injection. Administered in and pt tolerated well. Next due Jan 6- 20. .. Administrations This Visit     medroxyPROGESTERone  (DEPO-PROVERA ) injection 150 mg     Admin Date 01/20/2024 Action Given Dose 150 mg Route Intramuscular Documented By Doneta Laymon BIRCH, RN

## 2024-04-20 ENCOUNTER — Ambulatory Visit (INDEPENDENT_AMBULATORY_CARE_PROVIDER_SITE_OTHER): Payer: Self-pay

## 2024-04-20 VITALS — BP 127/87 | HR 67 | Wt 138.4 lb

## 2024-04-20 DIAGNOSIS — Z3042 Encounter for surveillance of injectable contraceptive: Secondary | ICD-10-CM

## 2024-04-20 NOTE — Progress Notes (Signed)
..  Date last pap: 04/16/2021. Last Depo-Provera : 01/20/2024. Side Effects if any: N/a. Serum HCG indicated? N/a. Depo-Provera  150 mg IM given in L [place] per pt request Next appointment due April 7-21. .. Administrations This Visit     medroxyPROGESTERone  (DEPO-PROVERA ) injection 150 mg     Admin Date 04/20/2024 Action Given Dose 150 mg Route Intramuscular Documented By Doneta Laymon BIRCH, RN

## 2024-07-06 ENCOUNTER — Ambulatory Visit: Payer: Self-pay
